# Patient Record
Sex: Female | Born: 1975
Health system: Southern US, Community
[De-identification: ages and names within clinical notes are randomized; demographics above are authoritative.]

## PROBLEM LIST (undated history)

## (undated) DIAGNOSIS — Z8619 Personal history of other infectious and parasitic diseases: Secondary | ICD-10-CM

## (undated) DIAGNOSIS — G43909 Migraine, unspecified, not intractable, without status migrainosus: Secondary | ICD-10-CM

## (undated) DIAGNOSIS — R350 Frequency of micturition: Secondary | ICD-10-CM

## (undated) DIAGNOSIS — R011 Cardiac murmur, unspecified: Secondary | ICD-10-CM

## (undated) DIAGNOSIS — N920 Excessive and frequent menstruation with regular cycle: Secondary | ICD-10-CM

## (undated) DIAGNOSIS — J309 Allergic rhinitis, unspecified: Secondary | ICD-10-CM

## (undated) HISTORY — PX: GASTRIC BYPASS: SHX52

## (undated) HISTORY — DX: Allergic rhinitis, unspecified: J30.9

## (undated) HISTORY — DX: Personal history of other infectious and parasitic diseases: Z86.19

## (undated) HISTORY — PX: ABLATION: SHX5711

## (undated) HISTORY — DX: Frequency of micturition: R35.0

## (undated) HISTORY — DX: Migraine, unspecified, not intractable, without status migrainosus: G43.909

## (undated) HISTORY — DX: Cardiac murmur, unspecified: R01.1

## (undated) HISTORY — PX: HERNIA REPAIR: SHX51

## (undated) HISTORY — DX: Excessive and frequent menstruation with regular cycle: N92.0

---

## 2004-08-29 ENCOUNTER — Observation Stay: Payer: Self-pay | Admitting: Obstetrics & Gynecology

## 2004-09-02 ENCOUNTER — Inpatient Hospital Stay: Payer: Self-pay

## 2005-06-04 HISTORY — PX: TUBAL LIGATION: SHX77

## 2006-03-10 ENCOUNTER — Inpatient Hospital Stay: Payer: Self-pay

## 2006-06-08 ENCOUNTER — Emergency Department: Payer: Self-pay | Admitting: Emergency Medicine

## 2006-12-30 ENCOUNTER — Emergency Department: Payer: Self-pay | Admitting: Emergency Medicine

## 2007-01-03 HISTORY — PX: UMBILICAL HERNIA REPAIR: SHX196

## 2007-01-14 ENCOUNTER — Ambulatory Visit: Payer: Self-pay | Admitting: Surgery

## 2007-01-16 ENCOUNTER — Ambulatory Visit: Payer: Self-pay | Admitting: Surgery

## 2007-05-13 ENCOUNTER — Ambulatory Visit: Payer: Self-pay | Admitting: Internal Medicine

## 2007-06-03 ENCOUNTER — Encounter: Payer: Self-pay | Admitting: Internal Medicine

## 2007-06-05 ENCOUNTER — Encounter: Payer: Self-pay | Admitting: Internal Medicine

## 2007-07-06 ENCOUNTER — Encounter: Payer: Self-pay | Admitting: Internal Medicine

## 2008-01-22 ENCOUNTER — Emergency Department: Payer: Self-pay | Admitting: Emergency Medicine

## 2008-01-23 ENCOUNTER — Ambulatory Visit: Payer: Self-pay | Admitting: Internal Medicine

## 2008-07-11 ENCOUNTER — Emergency Department: Payer: Self-pay | Admitting: Emergency Medicine

## 2008-07-16 ENCOUNTER — Ambulatory Visit: Payer: Self-pay | Admitting: Internal Medicine

## 2008-11-02 HISTORY — PX: LAMINECTOMY: SHX219

## 2009-02-01 ENCOUNTER — Ambulatory Visit: Payer: Self-pay | Admitting: Obstetrics & Gynecology

## 2009-02-02 ENCOUNTER — Ambulatory Visit: Payer: Self-pay | Admitting: Internal Medicine

## 2009-02-09 ENCOUNTER — Ambulatory Visit: Payer: Self-pay | Admitting: Obstetrics & Gynecology

## 2009-08-02 HISTORY — PX: OTHER SURGICAL HISTORY: SHX169

## 2009-08-19 IMAGING — CR DG LUMBAR SPINE AP/LAT/OBLIQUES W/ FLEX AND EXT
1 series · 5 of 5 positions shown · non-contrast
Comparison: none

REASON FOR EXAM: pain please send result back to dr
COMMENTS:

[Series 1: view not recorded · 0.17mm/px · 5 of 5 slices shown]
[im 1/5]
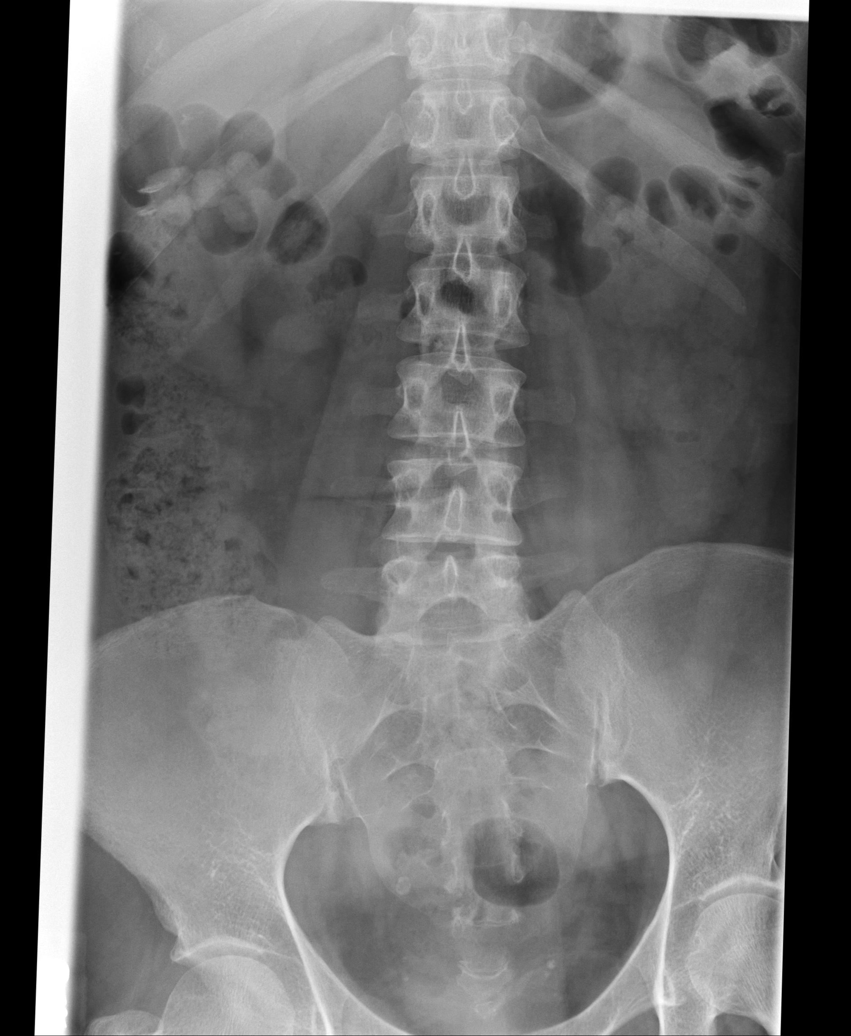
[im 2/5]
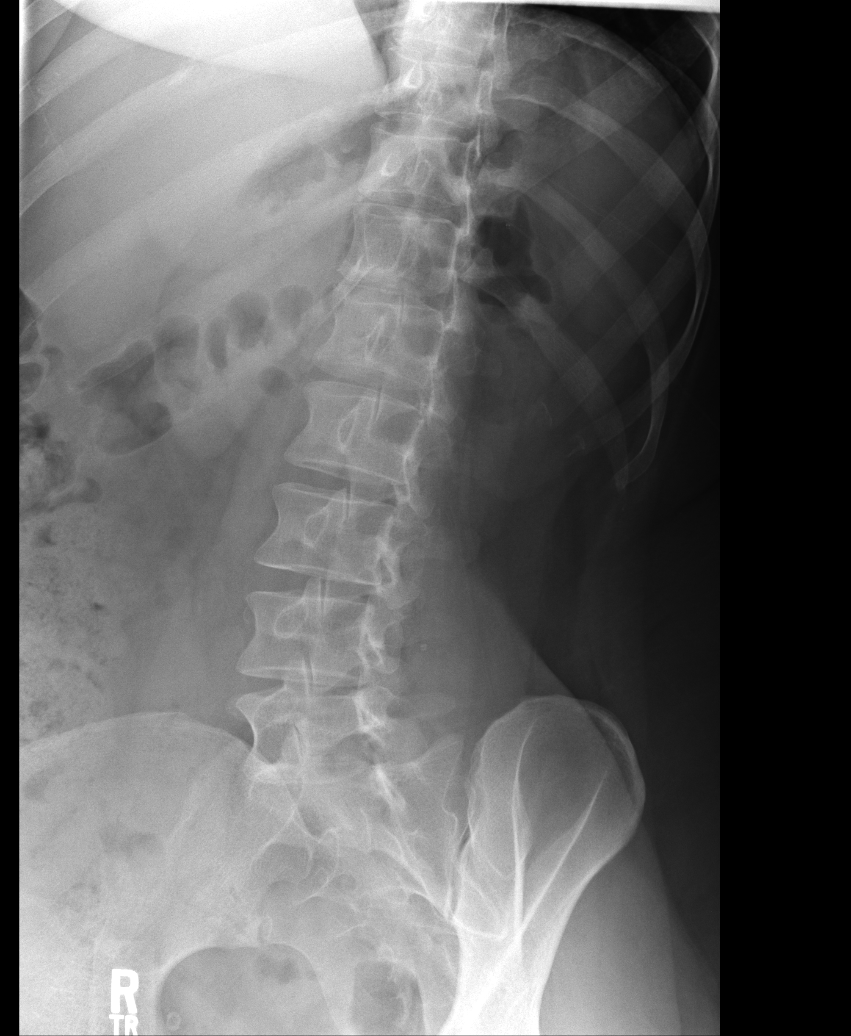
[im 3/5]
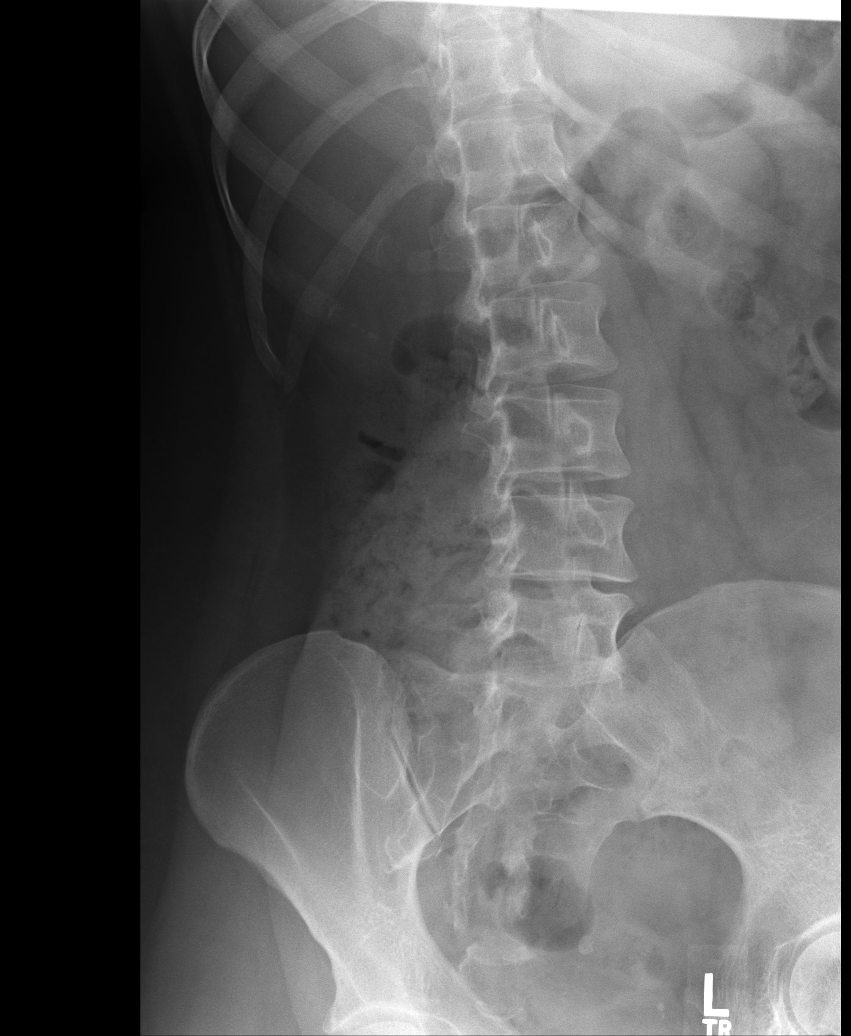
[im 4/5]
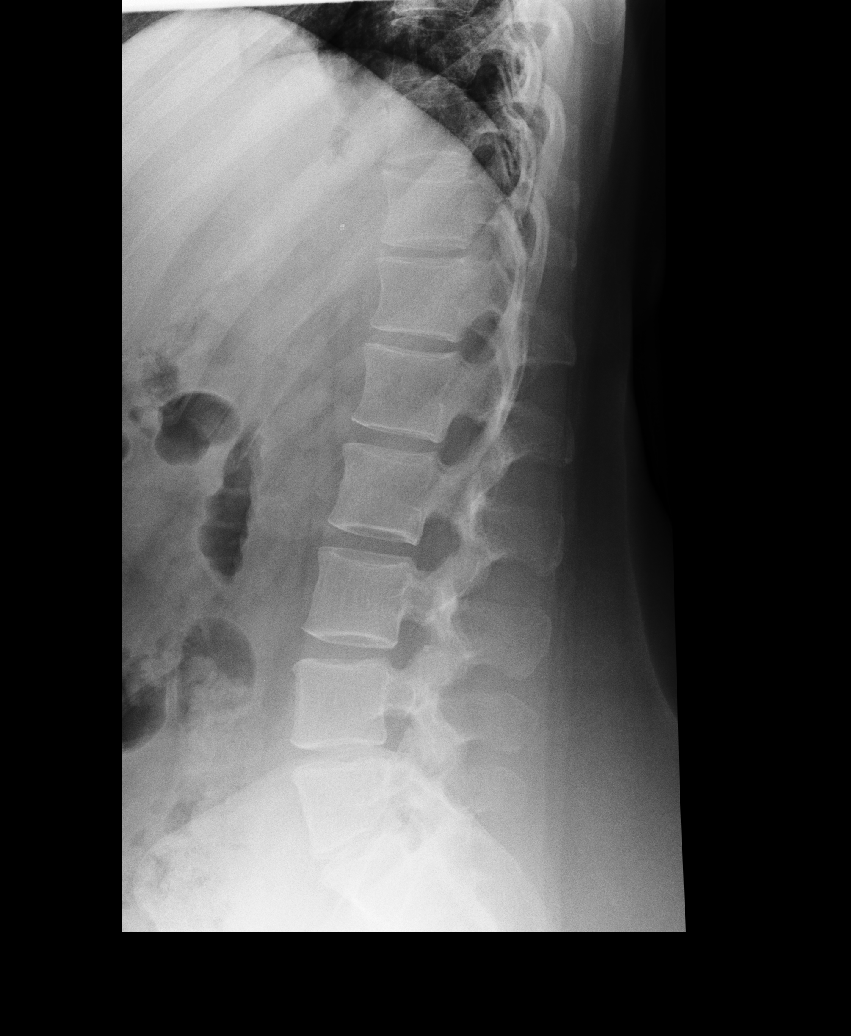
[im 5/5]
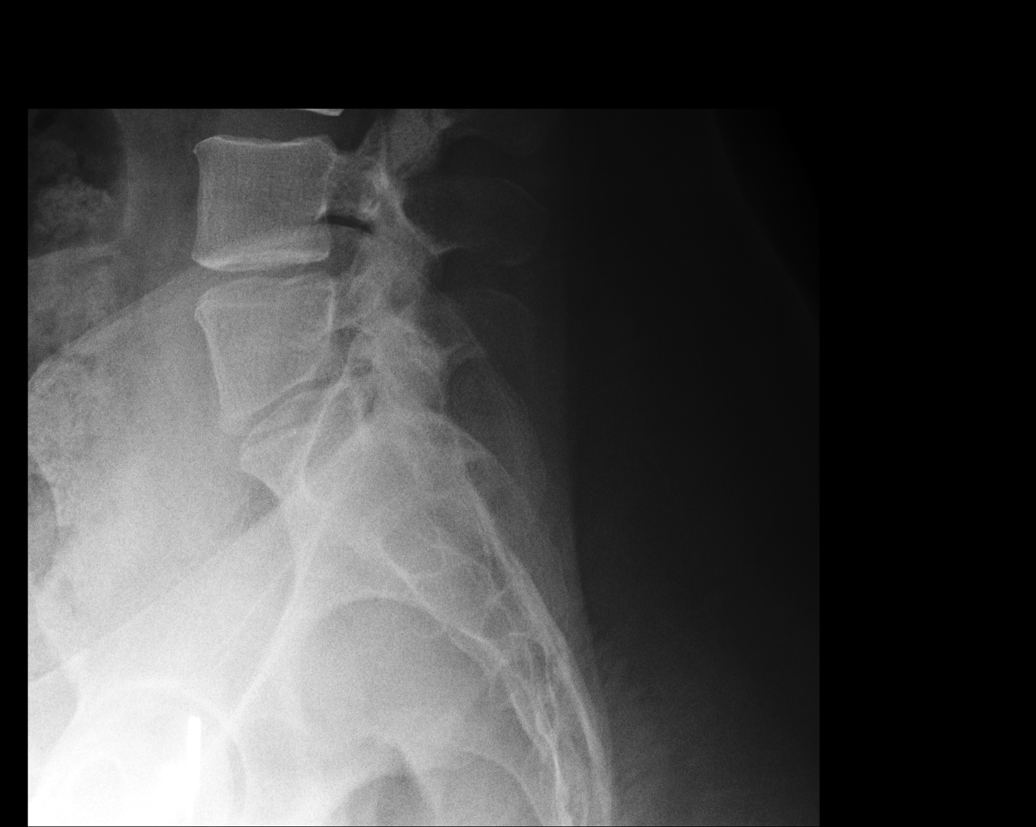

[5 of 5 positions shown; findings below may reference images not displayed]

PROCEDURE:     DXR - DXR LUMBAR SPINE WITH OBLIQUES  - May 13, 2007  [DATE]

RESULT:      The vertebral body heights are well maintained. The vertebral
body heights are well maintained. Vertebral body alignment is normal. There
is slight narrowing at the L4-L5 and L5-S1 intervertebral disc spaces. These
findings could be development but the possibility of early disc disease at
either site cannot be totally excluded. This could be further evaluated by
MR if such is clinically indicated. Oblique views show the articular facets
to be intact. The pedicles are normal appearance bilaterally.
IMPRESSION: 1. No fracture is seen.
2. There is slightly narrowed appearance to the L4-L5 and L5-S1
intervertebral disc spaces. The findings may be developmental but the
possibility of early manifestation of disc disease cannot be excluded. As
mentioned above, this could be further by MR if such is clinically indicated.

## 2009-10-30 ENCOUNTER — Ambulatory Visit: Payer: Self-pay | Admitting: Family Medicine

## 2009-12-24 ENCOUNTER — Ambulatory Visit: Payer: Self-pay | Admitting: Family Medicine

## 2010-07-04 NOTE — Assessment & Plan Note (Signed)
Summary: EAR INFECTION/JBB   Vital Signs:  Patient Profile:   35 Years Old Female CC:      left ear pain x 2 days// going to fly soon Height:     65.5 inches Weight:      207 pounds Temp:     98.5 degrees F oral Pulse rate:   80 / minute Pulse rhythm:   regular Resp:     18 per minute BP sitting:   106 / 70  (right arm) Cuff size:   large  Vitals Entered By: Providence Crosby LPN (Oct 30, 2009 11:52 AM)                  Current Allergies (reviewed today): ! PENICILLIN ! SULFA ! VICODIN ! VANCOMYCINHistory of Present Illness History from: patient Reason for visit: see chief complaint Chief Complaint: left ear pain x 2 days// going to fly soon History of Present Illness: left ear pain x 2 days// has a history of severe sinusitis.  Pt reports that she seen an ENT specialist at [facility] at least every 6 months.  Pt says she has gotten out of the habit of using her flonase regularly.  She had been told to use it daily.  She says that she has a long history of frequent ear infections.  She has a long history of having these infections so bad that she has had to use the [facility] to flush the sinuses.  Pt says she has normally taken cipro multiple times in the past as rxd by her ENT.  She is going out of town in a few days and wanted to have this cleared up if at all possible.   Current Meds TOPAMAX 100 MG TABS (TOPIRAMATE) 1 daily by mouth MULTIVITAMINS  TABS (MULTIPLE VITAMIN) 1 daily by mouth CALCIUM 600 IRON/D 600-18-125 MG-MG-UNIT TABS (CALCIUM-VITAMIN D-IRON) 1 tablet twice  a day DOXYCYCLINE HYCLATE 100 MG TABS (DOXYCYCLINE HYCLATE) take 1 by mouth two times a day as directed for treating infection. . . may cause some nausea FLONASE 50 MCG/ACT SUSP (FLUTICASONE PROPIONATE) 2 spray per nostril once daily  REVIEW OF SYSTEMS Constitutional Symptoms      Denies fever, chills, night sweats, weight loss, weight gain, and fatigue.  Eyes       Denies change in vision, eye pain, eye  discharge, glasses, contact lenses, and eye surgery. Ear/Nose/Throat/Mouth       Complains of ear pain, frequent runny nose, and sinus problems.      Denies hearing loss/aids, change in hearing, ear discharge, dizziness, frequent nose bleeds, sore throat, hoarseness, and tooth pain or bleeding.      Comments: also see HPI Respiratory       Denies dry cough, productive cough, wheezing, shortness of breath, asthma, bronchitis, and emphysema/COPD.  Cardiovascular       Denies murmurs, chest pain, and tires easily with exhertion.    Gastrointestinal       Denies stomach pain, nausea/vomiting, diarrhea, constipation, blood in bowel movements, and indigestion. Genitourniary       Denies painful urination, kidney stones, and loss of urinary control. Neurological       Complains of headaches.      Denies paralysis, seizures, and fainting/blackouts. Musculoskeletal       Denies muscle pain, joint pain, joint stiffness, decreased range of motion, redness, swelling, muscle weakness, and gout.  Skin       Denies bruising, unusual mles/lumps or sores, and hair/skin or nail changes.  Psych       Denies mood changes, temper/anger issues, anxiety/stress, speech problems, depression, and sleep problems.  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  Past Medical History: Chronic Sinusitis and Allergic Rhinitis Obesity Lap band surgery with gastric plication 08/25/2009 Laminectomy L4-L5 11/02/2008 umbiliical hernia 01-16-2007 bilateral tubal ligation 03/11/2006  Past Surgical History: Lap band surgery with gastric plication 08/25/2009 Laminectomy L4-L5 11/02/2008 umbilical hernia 01-16-2007 bilateral tubal ligation 10/08/200  Family  History: Father: alive at age 74 elevated bp Mother: alive with HTN and Hyperlipidemia Siblings: brother : alive and well Sister: 45 alive and well  Social History: Marital Status: Married Children: yes 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 Penicillin 2)  ! Sulfa 3)  ! Vicodin 4)  ! Vancomycin    Physical Exam General appearance: well developed, well nourished, no acute distress Head: normocephalic, atraumatic Eyes: conjunctivae and lids normal Pupils: equal, round, reactive to light Ears: fluid noted without inflammation left TM, appears yellow on the bottom layers Nasal: swollen red turbinates with congestion Oral/Pharynx: tongue normal, posterior pharynx without erythema or exudate Neck: neck supple,  trachea midline, no masses Chest/Lungs: no rales, wheezes, or rhonchi bilateral, breath sounds equal without effort Heart: regular rate and  rhythm, no murmur Abdomen: soft, non-tender without obvious organomegaly Extremities: normal extremities Neurological: grossly intact and non-focal Skin: no obvious rashes or lesions MSE: oriented to time, place, and person Assessment Problems:   New Problems: ALLERGIC RHINITIS (ICD-477.9) OTITIS MEDIA, SEROUS (ICD-381.4) SINUSITIS, CHRONIC (ICD-473.9)   Patient Education: Patient and/or caregiver instructed in the following: rest, fluids. Demonstrates willingness to comply.  Plan New Medications/Changes: DOXYCYCLINE HYCLATE 100 MG TABS (DOXYCYCLINE HYCLATE) take 1 by mouth two times a day as directed for treating infection. . . may cause some nausea  #28 x 0, 10/30/2009, Standley Dakins MD  New Orders: New Patient Level II [84132] Follow Up: Follow up in 1-2 days if no improvement, Follow up with Primary Physician Follow Up: with ENT physician  The patient and/or  caregiver has been counseled thoroughly with regard to medications prescribed including dosage, schedule, interactions, rationale for use, and possible side effects and they verbalize understanding.  Diagnoses and expected course of recovery discussed and will return if not improved as expected or if the condition worsens. Patient and/or caregiver verbalized understanding.  Prescriptions: DOXYCYCLINE HYCLATE 100 MG TABS (DOXYCYCLINE HYCLATE) take 1 by mouth two times a day as directed for treating infection. . . may cause some nausea  #28 x 0   Entered and Authorized by:   Standley Dakins MD   Signed by:   Standley Dakins MD on 10/30/2009   Method used:   Electronically to        [facility]. # [phone]* (retail)       [address]       [place], [place]  [address]       Ph: [phone]       Fax: [phone]   RxID:   [id number]   Patient Instructions: 1)  Please hold off taking the calcium/iron supplements until you  complete your antibiotic treatment (doxycycline). 2)  Please remember to start using your flonase nasal spray on a daily basis (2 sprays/nostril). 3)  Please follow up with your ENT specialist ASAP.  4)  Please follow up with your Primary Medical Care Provider. 5)  Please remember that the doxycycline can make you feel a little nauseated after you take it but that sensation should not last long and remember to avoid heavy sun exposure while taking doxycycline.  This medicine makes you more photosensitive.   The risks, benefits and possible side effects were clearly explained and discussed with the patient.  The patient verbalized clear understanding.  The patient was given instructions to return if symptoms don't improve, worsen or new changes develop.  If it is not during clinic hours and the patient cannot get back to this clinic then the patient was told to seek medical care at an available urgent care or emergency department.  The patient verbalized  understanding.    The patient was informed that there is no on-call provider or services available at this clinic during off-hours (when the clinic is closed).  If the patient developed a problem or concern that required immediate attention, the patient was advised to go the the nearest available urgent care or emergency department for medical care.  The patient verbalized understanding.     Rodney Langton, M.D., F.A.A.F.P.  Oct 30, 2009

## 2010-09-25 ENCOUNTER — Ambulatory Visit: Payer: Self-pay | Admitting: Internal Medicine

## 2011-04-22 ENCOUNTER — Other Ambulatory Visit: Payer: Self-pay | Admitting: Internal Medicine

## 2011-08-27 ENCOUNTER — Other Ambulatory Visit: Payer: Self-pay | Admitting: Internal Medicine

## 2011-08-27 MED ORDER — FLUTICASONE PROPIONATE 50 MCG/ACT NA SUSP: 2.0000 | Freq: Every day | NASAL | Status: DC

## 2011-08-27 NOTE — Telephone Encounter (Signed)
[facility] . flonase 50 mcg. Needing a refill prior to appt.

## 2011-10-26 ENCOUNTER — Ambulatory Visit: Payer: Self-pay | Admitting: Internal Medicine

## 2012-03-27 ENCOUNTER — Telehealth: Payer: Self-pay | Admitting: Internal Medicine

## 2012-03-27 NOTE — Telephone Encounter (Signed)
Pt came in today wanting to get flu shot and to make an appointment for sinus infection. Pt has never been seen at this practice made new patient appointment for 3/4 pt wanted to know if she could be seen sooner Pt had new  Patient appointment 10/26/11 and cancelled pt stated she wasn't sick and didn't need to see dr Darrick Huntsman at that time

## 2012-03-31 NOTE — Telephone Encounter (Signed)
i have not changed my previous response to this type of request.  i will not take up 2 urgent slots for a new patient .

## 2012-04-01 ENCOUNTER — Telehealth: Payer: Self-pay | Admitting: Internal Medicine

## 2012-04-01 NOTE — Telephone Encounter (Signed)
Left message about keeping her appt in March on pt's voicemail.

## 2012-04-01 NOTE — Telephone Encounter (Signed)
Pt called back she was fine with waiting until March for her appointment but she went to the Pharmacy to get the flu shot but her insurance does not cover any pharmacy visit. She is extremely worried and not sure what to do because she says her husband is a paraplegic and her kids are small. She can't be sick around her husband because of his health issues and is needing the flu shot. She wanted to me ask the office manager if she could come in just to have the flu shot ???

## 2012-04-01 NOTE — Telephone Encounter (Signed)
error 

## 2012-05-31 ENCOUNTER — Other Ambulatory Visit: Payer: Self-pay | Admitting: Internal Medicine

## 2012-08-04 ENCOUNTER — Ambulatory Visit (INDEPENDENT_AMBULATORY_CARE_PROVIDER_SITE_OTHER): Payer: BC Managed Care – PPO | Admitting: Internal Medicine

## 2012-08-04 ENCOUNTER — Encounter: Payer: Self-pay | Admitting: Internal Medicine

## 2012-08-04 VITALS — BP 100/70 | HR 78 | Temp 98.1°F | Ht 65.0 in | Wt 168.5 lb

## 2012-08-04 DIAGNOSIS — J309 Allergic rhinitis, unspecified: Secondary | ICD-10-CM

## 2012-08-04 DIAGNOSIS — R5383 Other fatigue: Secondary | ICD-10-CM

## 2012-08-04 DIAGNOSIS — F5105 Insomnia due to other mental disorder: Secondary | ICD-10-CM

## 2012-08-04 DIAGNOSIS — R5381 Other malaise: Secondary | ICD-10-CM

## 2012-08-04 DIAGNOSIS — R413 Other amnesia: Secondary | ICD-10-CM

## 2012-08-04 DIAGNOSIS — Z6825 Body mass index (BMI) 25.0-25.9, adult: Secondary | ICD-10-CM

## 2012-08-04 DIAGNOSIS — K5900 Constipation, unspecified: Secondary | ICD-10-CM

## 2012-08-04 DIAGNOSIS — F419 Anxiety disorder, unspecified: Secondary | ICD-10-CM | POA: Insufficient documentation

## 2012-08-04 DIAGNOSIS — Z9884 Bariatric surgery status: Secondary | ICD-10-CM | POA: Insufficient documentation

## 2012-08-04 DIAGNOSIS — J329 Chronic sinusitis, unspecified: Secondary | ICD-10-CM

## 2012-08-04 DIAGNOSIS — F411 Generalized anxiety disorder: Secondary | ICD-10-CM

## 2012-08-04 DIAGNOSIS — E663 Overweight: Secondary | ICD-10-CM

## 2012-08-04 DIAGNOSIS — K5909 Other constipation: Secondary | ICD-10-CM | POA: Insufficient documentation

## 2012-08-04 LAB — COMPREHENSIVE METABOLIC PANEL
Albumin: 4 g/dL (ref 3.5–5.2)
Alkaline Phosphatase: 36 U/L — ABNORMAL LOW (ref 39–117)
BUN: 14 mg/dL (ref 6–23)
CO2: 25 mEq/L (ref 19–32)
Calcium: 8.9 mg/dL (ref 8.4–10.5)
GFR: 93.08 mL/min (ref >60.00)
Glucose, Bld: 96 mg/dL (ref 70–99)
Potassium: 3.7 mEq/L (ref 3.5–5.1)

## 2012-08-04 LAB — TSH: TSH: 1.05 u[IU]/mL (ref 0.35–5.50)

## 2012-08-04 MED ORDER — ALPRAZOLAM 0.5 MG PO TABS: 0.5000 mg | ORAL_TABLET | Freq: Every evening | ORAL | Status: DC | PRN

## 2012-08-04 MED ORDER — FLUTICASONE PROPIONATE 50 MCG/ACT NA SUSP: NASAL | Status: DC

## 2012-08-04 NOTE — Assessment & Plan Note (Signed)
Advised to increase fiber in diet. Checking lytes, thyroid function.

## 2012-08-04 NOTE — Assessment & Plan Note (Signed)
secodnary to anxiety vs vitamin deficiencies,  Checking b12 and thyroid levels today

## 2012-08-04 NOTE — Assessment & Plan Note (Signed)
Prn alprazolam, used sparingly.  Life and financial stressors cited as cause.

## 2012-08-04 NOTE — Assessment & Plan Note (Signed)
Not on vitamin supplementation consistently. Checking vit d . B12, mg etc today.

## 2012-08-04 NOTE — Progress Notes (Signed)
Patient ID: Stephanie Boyd, female   DOB: January 03, 1976, 37 y.o.   MRN: [id number] Patient Active Problem List  Diagnosis  . Insomnia secondary to anxiety  . Overweight (BMI 25.0-29.9)  . Short-term memory loss  . S/P bariatric surgery  . Recurrent sinusitis  . Allergic rhinitis    Subjective:  CC:   Chief Complaint  Patient presents with  . Establish Care    HPI:   Stephanie Boyd is a 37 y.o. female who presents as a new patient to establish primary care with the chief complaint of Throat tickle, accompanied by nonproductive ough and headache,  Aggravated by supine postion..  Nasal drainage is clear.  Positive sick contacts, All the men in family had it last week.  Had the flu shot n October, the live virus nasal spray .   Overweight.  She has a history of morbid obesity,  S/p lap band with gastric plication  March 2010.  Has lost 75 lbs so far,  Has not plateaued yet . Has been taking bariatric vitamins inconsistently for the last year, nonce since December and has had some short term memory loss. Diet is low carb, lots of salad and meat, Averages only 2 meals per day bc of early morning intolerance to solid food.   Goal is 150.   Occasional rare insomnia lasting about a week .  Uses 0.25 mg alprazolam prn . Her husband Christiane Ha  is paraplegic and she has gone back to work due to financial pressures.  Her sons are aged 69 and 84  In 57 and 2nd grade .  Both have been diagnosed with fructose malabsorption which causes rash, severe constipation and encopresis in once son and severe diarrhea in the other with overindulgence in fructose. Thinks she may have had it herself, bc she has had the symptoms with migraines,  All of which have resolved with low carb diet.   Constipation,  3 to 4 hard stools per week.  Gets a lot of gas with increased fiber.  Treated with levaquin for sinusitis/otitis one month ago .  Has been having 4 or 5 sinus and ear infections per year .  Needs Ig levels  checked.    History of both hips having going out of joint during intercourse secondary to lax ligaments..    Health Maintenance: Dr. Tiburcio Pea did annual PAP last fall and mammogram (2nd) due this fall to be ordered by him   Past Medical History  Diagnosis Date  . History of chicken pox   . Migraines   . Allergic rhinitis   . Heart murmur     Past Surgical History  Procedure Laterality Date  . Tubal ligation  2007  . Umbilical hernia repair  01/2007  . Laminectomy  11/2008    L4-5  . Lapband  08/2009    and gastric plication    Family History  Problem Relation Age of Onset  . Hypertension Mother   . Diabetes Mother   . Hypertension Father   . Cancer Father     melonoma  . Heart disease Father     A-fib    History   Social History  . Marital Status: Married    Spouse Name: N/A    Number of Children: N/A  . Years of Education: N/A   Occupational History  . Not on file.   Social History Main Topics  . Smoking status: Never Smoker   . Smokeless tobacco: Never Used  . Alcohol Use: Yes  Comment: rarely  . Drug Use: No  . Sexually Active: Not on file   Other Topics Concern  . Not on file   Social History Narrative  . No narrative on file    Allergies  Allergen Reactions  . Hydrocodone-Acetaminophen     REACTION: rash  . Penicillins     REACTION: rash  . Sulfonamide Derivatives     REACTION: rash  . Vancomycin     REACTION: reddness   Review of Systems:   The remainder of the review of systems was negative except those addressed in the HPI.   Objective:  BP 100/70  Pulse 78  Temp(Src) 98.1 F (36.7 C) (Oral)  Ht 5\' 5"  (1.651 m)  Wt 168 lb 8 oz (76.431 kg)  BMI 28.04 kg/m2  SpO2 98%  LMP 08/04/2012  General appearance: alert, cooperative and appears stated age Ears: normal TM's and external ear canals both ears Throat: lips, mucosa, and tongue normal; teeth and gums normal Neck: no adenopathy, no carotid bruit, supple, symmetrical,  trachea midline and thyroid not enlarged, symmetric, no tenderness/mass/nodules Back: symmetric, no curvature. ROM normal. No CVA tenderness. Lungs: clear to auscultation bilaterally Heart: regular rate and rhythm, S1, S2 normal, no murmur, click, rub or gallop Abdomen: soft, non-tender; bowel sounds normal; no masses,  no organomegaly Pulses: 2+ and symmetric Skin: Skin color, texture, turgor normal. No rashes or lesions Lymph nodes: Cervical, supraclavicular, and axillary nodes normal.  Assessment and Plan:  Insomnia secondary to anxiety Prn alprazolam, used sparingly.  Life and financial stressors cited as cause.   Short-term memory loss secodnary to anxiety vs vitamin deficiencies,  Checking b12 and thyroid levels today  S/P bariatric surgery Not on vitamin supplementation consistently. Checking vit d . B12, mg etc today.  Recurrent sinusitis Checking Ig levels to rule out CVID/  Constipation, chronic Advised to increase fiber in diet. Checking lytes, thyroid function.    Updated Medication List Outpatient Encounter Prescriptions as of 08/04/2012  Medication Sig Dispense Refill  . fluticasone (FLONASE) 50 MCG/ACT nasal spray INSTILL 2 SPRAYS IN EACH NOSTRIL EVERY DAY  16 g  5  . [DISCONTINUED] fluticasone (FLONASE) 50 MCG/ACT nasal spray INSTILL 2 SPRAYS IN EACH NOSTRIL EVERY DAY  16 g  5  . ALPRAZolam (XANAX) 0.5 MG tablet Take 1 tablet (0.5 mg total) by mouth at bedtime as needed for sleep.  30 tablet  3   No facility-administered encounter medications on file as of 08/04/2012.

## 2012-08-04 NOTE — Patient Instructions (Addendum)
Try increasing the fiber in your diet to help your constipation   Dreamfield's   Low carb pasta  Mission: "Carb balance" whole wheat tortilla,  26 g fiber 6 net carbs  This is  my version of a  "Low GI"  Diet:  It is not ultra low carb, but will still lower your blood sugars and allow you to lose 5 to 10 lbs per month if you follow it carefully. All of the foods can be found at grocery stores and in bulk at [employer].  The Atkins protein bars and shakes are available in more varieties at [employer], [employer] and [employer].     7 AM Breakfast:  Low carbohydrate Protein  Shakes (I recommend the EAS AdvantEdge "Carb Control" shakes  Or the low carb shakes by Atkins.   Both are available everywhere:  In  cases at [employer]Js  Or in 4 packs at grocery stores and pharmacies  2.5 carbs  (Alternative is  a toasted Arnold's Sandwhich Thin w/ peanut butter, a "Bagel Thin" with cream cheese and salmon) or  a scrambled egg burrito made with a low carb tortilla .  Avoid cereal and bananas, oatmeal too unless you are cooking the old fashioned kind that takes 30-40 minutes to prepare.  the rest is overly processed, has minimal fiber, and is loaded with carbohydrates!   10 AM: Protein bar by Atkins (the snack size, under 200 cal).  There are many varieties , available widely again or in bulk in limited varieties at BJs)  Other so called "protein bars" tend to be loaded with carbohydrates.  Remember, in food advertising, the word "energy" is synonymous for " carbohydrate."  Lunch: sandwich of [place], (or any lunchmeat, grilled meat or canned tuna), fresh avocado, mayonnaise  and cheese on a lower carbohydrate pita bread, flatbread, or tortilla . Ok to use regular mayonnaise. The bread is the only source or carbohydrate that can be decreased (Joseph's makes a pita bread and a flat bread that are 50 cal and 4 net carbs ; Toufayan makes a low carb flatbread that's 100 cal and 9 net carbs  and  Mission makes a low carb whole wheat  tortilla  That is 210 cal and 6 net carbs)  3 PM:  Mid day :  Another protein bar,  Or a  cheese stick (100 cal, 0 carbs),  Or 1 ounce of  almonds, walnuts, pistachios, pecans, peanuts,  Macadamia nuts. Or a Dannon light n Fit greek yogurt, 80 cal 8 net carbs . Avoid "granola"; the dried cranberries and raisins are loaded with carbohydrates. Mixed nuts ok if no raisins or cranberries or dried fruit.      6 PM  Dinner:  "mean and green:"  Meat/chicken/fish or a high protein legume; , with a green salad, and a low GI  Veggie (broccoli, cauliflower, green beans, spinach, brussel sprouts. Lima beans) : Avoid "Low fat dressings, as well as Reyne Dumas and [address]! They are loaded with sugar! Instead use ranch, vinagrette,  Blue cheese, etc.  There is a low carb pasta by Dreamfield's available at [facility] that is acceptable and tastes great. Try Michel Angel's chicken piccata over low carb pasta. The chicken dish is 0 carbs, and can be found in frozen section at BJs and [employer]. Also try [employer] "Carnitas" (pulled pork, no sauce,  0 carbs) and his pot roast.   both are in the refrigerated section at BJs   Dreamfield's makes a low  carb pasta only 5 g/serving.  Available at all grocery stores,  And tastes like normal pasta  9 PM snack : Breyer's "low carb" fudgsicle or  ice cream bar (Carb Smart line), or  Weight Watcher's ice cream bar , or another "no sugar added" ice cream;a serving of fresh berries/cherries with whipped cream (Avoid bananas, pineapple, grapes  and watermelon on a regular basis because they are high in sugar)   Remember that snack Substitutions should be less than 10 carbs per serving and meals < 20 carbs. Remember to subtract fiber grams and sugar alcohols to get the "net carbs."

## 2012-08-04 NOTE — Assessment & Plan Note (Signed)
Checking Ig levels to rule out CVID/

## 2012-08-05 ENCOUNTER — Encounter: Payer: Self-pay | Admitting: Internal Medicine

## 2012-08-05 LAB — IGG, IGA, IGM: IgM, Serum: 112 mg/dL (ref 52–322)

## 2012-08-05 LAB — VITAMIN D 25 HYDROXY (VIT D DEFICIENCY, FRACTURES): Vit D, 25-Hydroxy: 26 ng/mL — ABNORMAL LOW (ref 30–89)

## 2012-08-29 ENCOUNTER — Encounter: Payer: Self-pay | Admitting: Internal Medicine

## 2012-11-27 ENCOUNTER — Encounter: Payer: Self-pay | Admitting: Adult Health

## 2012-11-27 ENCOUNTER — Ambulatory Visit (INDEPENDENT_AMBULATORY_CARE_PROVIDER_SITE_OTHER): Payer: BC Managed Care – PPO | Admitting: Adult Health

## 2012-11-27 VITALS — BP 100/58 | HR 68 | Temp 98.6°F | Resp 12 | Wt 167.5 lb

## 2012-11-27 DIAGNOSIS — H9203 Otalgia, bilateral: Secondary | ICD-10-CM

## 2012-11-27 DIAGNOSIS — H9209 Otalgia, unspecified ear: Secondary | ICD-10-CM

## 2012-11-27 MED ORDER — AZITHROMYCIN 250 MG PO TABS: ORAL_TABLET | ORAL | Status: DC

## 2012-11-27 MED ORDER — ANTIPYRINE-BENZOCAINE 5.4-1.4 % OT SOLN: 3.0000 [drp] | OTIC | Status: DC | PRN

## 2012-11-27 NOTE — Assessment & Plan Note (Signed)
Her pain is only occurring when she lies down at bedtime. This may be secondary to increased congestion within her sinuses. Her tympanic membranes bilaterally appear normal. I have provided patient with a prescription for azithromycin to take with her on vacation in the event that she develops fever, greenish drainage. I have advised her not to take this prescription unless those symptoms develop. Prescription for Auralgan ear drops for pain.

## 2012-11-27 NOTE — Progress Notes (Signed)
  Subjective:    Patient ID: Stephanie Boyd, female    DOB: 18-Jul-1975, 36 y.o.   MRN: [id number]  HPI  Patient is a 37 year old female who presents to clinic with complaints of bilateral ear pain mainly when she lies down at night. Patient has history of significant sinus problems with recurrent sinusitis. She is leaving for vacation with her family tomorrow for the [employer] and wants to make sure that she is not developing an infection. She denies fever or chills, rhinorrhea. Patient is beginning to feel slightly congested with in her sinuses. She uses a nettie pot to irrigate sinuses regularly. She is also using the Dymista nasal spray. Patient is not currently on an oral antihistamine.  Current Outpatient Prescriptions on File Prior to Visit  Medication Sig Dispense Refill  . ALPRAZolam (XANAX) 0.5 MG tablet Take 1 tablet (0.5 mg total) by mouth at bedtime as needed for sleep.  30 tablet  3  . fluticasone (FLONASE) 50 MCG/ACT nasal spray INSTILL 2 SPRAYS IN EACH NOSTRIL EVERY DAY  16 g  5   No current facility-administered medications on file prior to visit.    Review of Systems  Constitutional: Negative for fever and chills.  HENT: Positive for ear pain and congestion. Negative for sore throat, rhinorrhea and postnasal drip.   Respiratory: Negative for cough.   Cardiovascular: Negative.   Allergic/Immunologic:       Seasonal allergies       Objective:   Physical Exam  Constitutional: She is oriented to person, place, and time. She appears well-developed and well-nourished. No distress.  HENT:  Head: Normocephalic and atraumatic.  Right Ear: External ear normal.  Left Ear: External ear normal.  Nose: Nose normal.  Mouth/Throat: Oropharynx is clear and moist.  Eyes: Conjunctivae are normal. Pupils are equal, round, and reactive to light.  Neck: Normal range of motion. Neck supple.  Cardiovascular: Normal rate and regular rhythm.   Pulmonary/Chest: Effort normal. No  respiratory distress.  Lymphadenopathy:    She has no cervical adenopathy.  Neurological: She is alert and oriented to person, place, and time.  Skin: Skin is warm and dry.  Psychiatric: She has a normal mood and affect. Her behavior is normal. Judgment and thought content normal.          Assessment & Plan:

## 2013-04-05 ENCOUNTER — Other Ambulatory Visit: Payer: Self-pay | Admitting: Internal Medicine

## 2013-04-09 ENCOUNTER — Other Ambulatory Visit: Payer: Self-pay

## 2013-08-22 ENCOUNTER — Other Ambulatory Visit: Payer: Self-pay | Admitting: Internal Medicine

## 2013-11-06 ENCOUNTER — Other Ambulatory Visit: Payer: Self-pay | Admitting: Internal Medicine

## 2013-12-02 HISTORY — PX: ABDOMINOPLASTY: SUR9

## 2014-08-16 ENCOUNTER — Encounter
Admit: 2014-08-16 | Disposition: A | Discharge status: Home / Self Care | Payer: Self-pay | Attending: Rehabilitation | Admitting: Rehabilitation

## 2014-09-03 ENCOUNTER — Encounter
Admit: 2014-09-03 | Disposition: A | Discharge status: Home / Self Care | Payer: Self-pay | Attending: Rehabilitation | Admitting: Rehabilitation

## 2014-09-24 ENCOUNTER — Encounter: Payer: Self-pay | Admitting: Internal Medicine

## 2014-09-24 ENCOUNTER — Ambulatory Visit (INDEPENDENT_AMBULATORY_CARE_PROVIDER_SITE_OTHER): Payer: Managed Care, Other (non HMO) | Admitting: Internal Medicine

## 2014-09-24 VITALS — BP 108/66 | HR 80 | Temp 97.9°F | Resp 14 | Ht 65.25 in | Wt 162.2 lb

## 2014-09-24 DIAGNOSIS — Z1159 Encounter for screening for other viral diseases: Secondary | ICD-10-CM

## 2014-09-24 DIAGNOSIS — K5909 Other constipation: Secondary | ICD-10-CM

## 2014-09-24 DIAGNOSIS — Z23 Encounter for immunization: Secondary | ICD-10-CM | POA: Diagnosis not present

## 2014-09-24 DIAGNOSIS — E785 Hyperlipidemia, unspecified: Secondary | ICD-10-CM | POA: Diagnosis not present

## 2014-09-24 DIAGNOSIS — Z9884 Bariatric surgery status: Secondary | ICD-10-CM

## 2014-09-24 DIAGNOSIS — E538 Deficiency of other specified B group vitamins: Secondary | ICD-10-CM | POA: Diagnosis not present

## 2014-09-24 DIAGNOSIS — E559 Vitamin D deficiency, unspecified: Secondary | ICD-10-CM

## 2014-09-24 DIAGNOSIS — D509 Iron deficiency anemia, unspecified: Secondary | ICD-10-CM

## 2014-09-24 DIAGNOSIS — E663 Overweight: Secondary | ICD-10-CM

## 2014-09-24 DIAGNOSIS — Z0000 Encounter for general adult medical examination without abnormal findings: Secondary | ICD-10-CM

## 2014-09-24 DIAGNOSIS — R5383 Other fatigue: Secondary | ICD-10-CM

## 2014-09-24 DIAGNOSIS — K5900 Constipation, unspecified: Secondary | ICD-10-CM

## 2014-09-24 DIAGNOSIS — K5901 Slow transit constipation: Secondary | ICD-10-CM

## 2014-09-24 DIAGNOSIS — Z0189 Encounter for other specified special examinations: Secondary | ICD-10-CM

## 2014-09-24 MED ORDER — AZELASTINE-FLUTICASONE 137-50 MCG/ACT NA SUSP: NASAL | Status: DC

## 2014-09-24 NOTE — Progress Notes (Signed)
Patient ID: Stephanie Boyd, female   DOB: 22-Nov-1975, 39 y.o.   MRN: [id number]     Subjective:   4  Stephanie Boyd is a 39 y.o. female and is here for a comprehensive physical exam. The patient reports recent problems with weight gain due to problems with her lap band shifting . She had a net 45 LB WT LOSS SINCE GASTRIC SURGERY which was done March  2010.  5 LBS SINCE LAST VISIT 2014.  Had an abdominoplasty last summer.  Pelvic and breast exams are done by GYN due to abnormal PAP smears.  Colposcopy dec 3rd normal,  6 month follow up planned     History   Social History  . Marital Status: Married    Spouse Name: N/A  . Number of Children: N/A  . Years of Education: N/A   Occupational History  . Not on file.   Social History Main Topics  . Smoking status: Never Smoker   . Smokeless tobacco: Never Used  . Alcohol Use: Yes     Comment: rarely  . Drug Use: No  . Sexual Activity: Not on file   Other Topics Concern  . Not on file   Social History Narrative   Health Maintenance  Topic Date Due  . HIV Screening  01/11/1989  . PAP SMEAR  01/12/1992  . INFLUENZA VACCINE  01/03/2015  . TETANUS/TDAP  09/23/2024    The following portions of the patient's history were reviewed and updated as appropriate: allergies, current medications, past family history, past medical history, past social history, past surgical history and problem list.  Review of Systems  Patient denies headache, fevers, malaise, unintentional weight loss, skin rash, eye pain, sinus congestion and sinus pain, sore throat, dysphagia,  hemoptysis , cough, dyspnea, wheezing, chest pain, palpitations, orthopnea, edema, abdominal pain, nausea, melena, diarrhea, constipation, flank pain, dysuria, hematuria, urinary  Frequency, nocturia, numbness, tingling, seizures,  Focal weakness, Loss of consciousness,  Tremor, insomnia, depression, anxiety, and suicidal ideation.      Objective:   BP 108/66 mmHg  Pulse 80   Temp(Src) 97.9 F (36.6 C) (Oral)  Resp 14  Ht 5' 5.25" (1.657 m)  Wt 162 lb 4 oz (73.596 kg)  BMI 26.80 kg/m2  SpO2 98%  LMP 09/16/2014 (Exact Date)  General appearance: alert, cooperative and appears stated age Ears: normal TM's and external ear canals both ears Throat: lips, mucosa, and tongue normal; teeth and gums normal Neck: no adenopathy, no carotid bruit, supple, symmetrical, trachea midline and thyroid not enlarged, symmetric, no tenderness/mass/nodules Back: symmetric, no curvature. ROM normal. No CVA tenderness. Lungs: clear to auscultation bilaterally Heart: regular rate and rhythm, S1, S2 normal, no murmur, click, rub or gallop Abdomen: soft, non-tender; bowel sounds normal; no masses,  no organomegaly Pulses: 2+ and symmetric Skin: Skin color, texture, turgor normal. No rashes or lesions Lymph nodes: Cervical, supraclavicular, and axillary nodes normal.  .    Assessment and Plan:   Problem List Items Addressed This Visit    Overweight (BMI 25.0-29.9)    I have addressed  BMI and recommended wt loss of 10% of body weigh over the next 6 months using a low glycemic index diet and regular exercise a minimum of 5 days per week.        S/P bariatric surgery    With recent malfunction now resolved.  Weight gain occurred, which she has addressed.       Constipation, chronic    Recommended an increase  in fiber intake to 25 to 35 grams daily.  Made several dietary suggestions of high fiber content including Mission low carb whole wheat tortillas which have 26 g fiber/serving, Toufayan flatbread which has around 10 g fiber,   Daily serving of any type of nuts,  and Atkins protein bars which have 8 to 10 g fiber.   Dispelled the popularly held notion that [employer] oatmeal, whole grain bread and most commericially made cereals have adequate fiber.  Finally I recommended daily use of Miralax., metamucil, citrucel, benefiber  or fibercon.       Wellness examination     Annual  wellness  exam was done as well as a comprehensive physical exam and management of acute and chronic conditions .  During the course of the visit the patient was educated and counseled about appropriate screening and preventive services including : fall prevention , diabetes screening, nutrition counseling, colorectal cancer screening, and recommended immunizations.  Printed recommendations for health maintenance screenings was given.       RESOLVED: Constipation    Other Visit Diagnoses    B12 deficiency    -  Primary    Relevant Orders    Vitamin B12    Vitamin D deficiency        Relevant Orders    Vit D  25 hydroxy (rtn osteoporosis monitoring)    Other fatigue        Relevant Orders    Comprehensive metabolic panel    TSH    Hyperlipidemia        Relevant Orders    Lipid panel    Need for hepatitis C screening test        Relevant Orders    Hepatitis C antibody    Anemia, iron deficiency        Relevant Orders    CBC with Differential/Platelet    IBC panel    Need for prophylactic vaccination with combined diphtheria-tetanus-pertussis (DTP) vaccine        Relevant Orders    Tdap vaccine greater than or equal to 7yo IM (Completed)

## 2014-09-24 NOTE — Patient Instructions (Addendum)
You look fantastic!!    Here are a couple of low carb snacks to help you stay on track  Premier  Protein chocolate shake  WAS[employer] Crackers ([employer],  [employer])     Mini sweet peppers  Try artichoke tapenade , sundried tomato tapenade,   Joseph's Lavash bread  Remember to subtract fiber grams!  Your constipation is from a lack of fiber in your diet I  Recommend  daily use of Miralax., metamucil, citrucel, benefiber  or fibercon for your constipation.      Health Maintenance Adopting a healthy lifestyle and getting preventive care can go a long way to promote health and wellness. Talk with your health care provider about what schedule of regular examinations is right for you. This is a good chance for you to check in with your provider about disease prevention and staying healthy. In between checkups, there are plenty of things you can do on your own. Experts have done a lot of research about which lifestyle changes and preventive measures are most likely to keep you healthy. Ask your health care provider for more information. WEIGHT AND DIET  Eat a healthy diet  Be sure to include plenty of vegetables, fruits, low-fat dairy products, and lean protein.  Do not eat a lot of foods high in solid fats, added sugars, or salt.  Get regular exercise. This is one of the most important things you can do for your health.  Most adults should exercise for at least 150 minutes each week. The exercise should increase your heart rate and make you sweat (moderate-intensity exercise).  Most adults should also do strengthening exercises at least twice a week. This is in addition to the moderate-intensity exercise.  Maintain a healthy weight  Body mass index (BMI) is a measurement that can be used to identify possible weight problems. It estimates body fat based on height and weight. Your health care provider can help determine your BMI and help you achieve or maintain a healthy weight.  For females 32  years of age and older:   A BMI below 18.5 is considered underweight.  A BMI of 18.5 to 24.9 is normal.  A BMI of 25 to 29.9 is considered overweight.  A BMI of 30 and above is considered obese.  Watch levels of cholesterol and blood lipids  You should start having your blood tested for lipids and cholesterol at 41 years of age, then have this test every 5 years.  You may need to have your cholesterol levels checked more often if:  Your lipid or cholesterol levels are high.  You are older than 41 years of age.  You are at high risk for heart disease.  CANCER SCREENING   Lung Cancer  Lung cancer screening is recommended for adults 65-59 years old who are at high risk for lung cancer because of a history of smoking.  A yearly low-dose CT scan of the lungs is recommended for people who:  Currently smoke.  Have quit within the past 15 years.  Have at least a 30-pack-year history of smoking. A pack year is smoking an average of one pack of cigarettes a day for 1 year.  Yearly screening should continue until it has been 15 years since you quit.  Yearly screening should stop if you develop a health problem that would prevent you from having lung cancer treatment.  Breast Cancer  Practice breast self-awareness. This means understanding how your breasts normally appear and feel.  It also means  doing regular breast self-exams. Let your health care provider know about any changes, no matter how small.  If you are in your 20s or 30s, you should have a clinical breast exam (CBE) by a health care provider every 1-3 years as part of a regular health exam.  If you are 61 or older, have a CBE every year. Also consider having a breast X-ray (mammogram) every year.  If you have a family history of breast cancer, talk to your health care provider about genetic screening.  If you are at high risk for breast cancer, talk to your health care provider about having an MRI and a mammogram  every year.  Breast cancer gene (BRCA) assessment is recommended for women who have family members with BRCA-related cancers. BRCA-related cancers include:  Breast.  Ovarian.  Tubal.  Peritoneal cancers.  Results of the assessment will determine the need for genetic counseling and BRCA1 and BRCA2 testing. Cervical Cancer Routine pelvic examinations to screen for cervical cancer are no longer recommended for nonpregnant women who are considered low risk for cancer of the pelvic organs (ovaries, uterus, and vagina) and who do not have symptoms. A pelvic examination may be necessary if you have symptoms including those associated with pelvic infections. Ask your health care provider if a screening pelvic exam is right for you.   The Pap test is the screening test for cervical cancer for women who are considered at risk.  If you had a hysterectomy for a problem that was not cancer or a condition that could lead to cancer, then you no longer need Pap tests.  If you are older than 65 years, and you have had normal Pap tests for the past 10 years, you no longer need to have Pap tests.  If you have had past treatment for cervical cancer or a condition that could lead to cancer, you need Pap tests and screening for cancer for at least 20 years after your treatment.  If you no longer get a Pap test, assess your risk factors if they change (such as having a new sexual partner). This can affect whether you should start being screened again.  Some women have medical problems that increase their chance of getting cervical cancer. If this is the case for you, your health care provider may recommend more frequent screening and Pap tests.  The human papillomavirus (HPV) test is another test that may be used for cervical cancer screening. The HPV test looks for the virus that can cause cell changes in the cervix. The cells collected during the Pap test can be tested for HPV.  The HPV test can be used to  screen women 69 years of age and older. Getting tested for HPV can extend the interval between normal Pap tests from three to five years.  An HPV test also should be used to screen women of any age who have unclear Pap test results.  After 41 years of age, women should have HPV testing as often as Pap tests.  Colorectal Cancer  This type of cancer can be detected and often prevented.  Routine colorectal cancer screening usually begins at 41 years of age and continues through 41 years of age.  Your health care provider may recommend screening at an earlier age if you have risk factors for colon cancer.  Your health care provider may also recommend using home test kits to check for hidden blood in the stool.  A small camera at the end of a  tube can be used to examine your colon directly (sigmoidoscopy or colonoscopy). This is done to check for the earliest forms of colorectal cancer.  Routine screening usually begins at age 34.  Direct examination of the colon should be repeated every 5-10 years through 42 years of age. However, you may need to be screened more often if early forms of precancerous polyps or small growths are found. Skin Cancer  Check your skin from head to toe regularly.  Tell your health care provider about any new moles or changes in moles, especially if there is a change in a mole's shape or color.  Also tell your health care provider if you have a mole that is larger than the size of a pencil eraser.  Always use sunscreen. Apply sunscreen liberally and repeatedly throughout the day.  Protect yourself by wearing long sleeves, pants, a wide-brimmed hat, and sunglasses whenever you are outside. HEART DISEASE, DIABETES, AND HIGH BLOOD PRESSURE   Have your blood pressure checked at least every 1-2 years. High blood pressure causes heart disease and increases the risk of stroke.  If you are between 68 years and 46 years old, ask your health care provider if you should  take aspirin to prevent strokes.  Have regular diabetes screenings. This involves taking a blood sample to check your fasting blood sugar level.  If you are at a normal weight and have a low risk for diabetes, have this test once every three years after 41 years of age.  If you are overweight and have a high risk for diabetes, consider being tested at a younger age or more often. PREVENTING INFECTION  Hepatitis B  If you have a higher risk for hepatitis B, you should be screened for this virus. You are considered at high risk for hepatitis B if:  You were born in a country where hepatitis B is common. Ask your health care provider which countries are considered high risk.  Your parents were born in a high-risk country, and you have not been immunized against hepatitis B (hepatitis B vaccine).  You have HIV or AIDS.  You use needles to inject street drugs.  You live with someone who has hepatitis B.  You have had sex with someone who has hepatitis B.  You get hemodialysis treatment.  You take certain medicines for conditions, including cancer, organ transplantation, and autoimmune conditions. Hepatitis C  Blood testing is recommended for:  Everyone born from [birth year] through 1967.  Anyone with known risk factors for hepatitis C. Sexually transmitted infections (STIs)  You should be screened for sexually transmitted infections (STIs) including gonorrhea and chlamydia if:  You are sexually active and are younger than 41 years of age.  You are older than 41 years of age and your health care provider tells you that you are at risk for this type of infection.  Your sexual activity has changed since you were last screened and you are at an increased risk for chlamydia or gonorrhea. Ask your health care provider if you are at risk.  If you do not have HIV, but are at risk, it may be recommended that you take a prescription medicine daily to prevent HIV infection. This is called  pre-exposure prophylaxis (PrEP). You are considered at risk if:  You are sexually active and do not regularly use condoms or know the HIV status of your partner(s).  You take drugs by injection.  You are sexually active with a partner who has HIV. Talk with  your health care provider about whether you are at high risk of being infected with HIV. If you choose to begin PrEP, you should first be tested for HIV. You should then be tested every 3 months for as long as you are taking PrEP.  PREGNANCY   If you are premenopausal and you may become pregnant, ask your health care provider about preconception counseling.  If you may become pregnant, take 400 to 800 micrograms (mcg) of folic acid every day.  If you want to prevent pregnancy, talk to your health care provider about birth control (contraception). OSTEOPOROSIS AND MENOPAUSE   Osteoporosis is a disease in which the bones lose minerals and strength with aging. This can result in serious bone fractures. Your risk for osteoporosis can be identified using a bone density scan.  If you are 59 years of age or older, or if you are at risk for osteoporosis and fractures, ask your health care provider if you should be screened.  Ask your health care provider whether you should take a calcium or vitamin D supplement to lower your risk for osteoporosis.  Menopause may have certain physical symptoms and risks.  Hormone replacement therapy may reduce some of these symptoms and risks. Talk to your health care provider about whether hormone replacement therapy is right for you.  HOME CARE INSTRUCTIONS   Schedule regular health, dental, and eye exams.  Stay current with your immunizations.   Do not use any tobacco products including cigarettes, chewing tobacco, or electronic cigarettes.  If you are pregnant, do not drink alcohol.  If you are breastfeeding, limit how much and how often you drink alcohol.  Limit alcohol intake to no more than 1  drink per day for nonpregnant women. One drink equals 12 ounces of beer, 5 ounces of wine, or 1 ounces of hard liquor.  Do not use street drugs.  Do not share needles.  Ask your health care provider for help if you need support or information about quitting drugs.  Tell your health care provider if you often feel depressed.  Tell your health care provider if you have ever been abused or do not feel safe at home. Document Released: 12/04/2010 Document Revised: 10/05/2013 Document Reviewed: 04/22/2013 [facility] Patient Information 2015 [place], [place]. This information is not intended to replace advice given to you by your health care provider. Make sure you discuss any questions you have with your health care provider.

## 2014-09-26 ENCOUNTER — Encounter: Payer: Self-pay | Admitting: Internal Medicine

## 2014-09-26 DIAGNOSIS — Z0001 Encounter for general adult medical examination with abnormal findings: Secondary | ICD-10-CM | POA: Insufficient documentation

## 2014-09-26 DIAGNOSIS — Z0000 Encounter for general adult medical examination without abnormal findings: Secondary | ICD-10-CM | POA: Insufficient documentation

## 2014-09-26 DIAGNOSIS — K5900 Constipation, unspecified: Secondary | ICD-10-CM | POA: Insufficient documentation

## 2014-09-26 NOTE — Assessment & Plan Note (Signed)
Annual  wellness  exam was done as well as a comprehensive physical exam and management of acute and chronic conditions .  During the course of the visit the patient was educated and counseled about appropriate screening and preventive services including : fall prevention , diabetes screening, nutrition counseling, colorectal cancer screening, and recommended immunizations.  Printed recommendations for health maintenance screenings was given.  

## 2014-09-26 NOTE — Assessment & Plan Note (Signed)
Recommended an increase in fiber intake to 25 to 35 grams daily.  Made several dietary suggestions of high fiber content including Mission low carb whole wheat tortillas which have 26 g fiber/serving, Toufayan flatbread which has around 10 g fiber,   Daily serving of any type of nuts,  and Atkins protein bars which have 8 to 10 g fiber.   Dispelled the popularly held notion that Quaker oats oatmeal, whole grain bread and most commericially made cereals have adequate fiber.  Finally I recommended daily use of Miralax., metamucil, citrucel, benefiber  or fibercon.  

## 2014-09-26 NOTE — Assessment & Plan Note (Signed)
I have addressed  BMI and recommended wt loss of 10% of body weigh over the next 6 months using a low glycemic index diet and regular exercise a minimum of 5 days per week.   

## 2014-09-26 NOTE — Assessment & Plan Note (Signed)
With recent malfunction now resolved.  Weight gain occurred, which she has addressed.

## 2014-09-30 ENCOUNTER — Other Ambulatory Visit (INDEPENDENT_AMBULATORY_CARE_PROVIDER_SITE_OTHER): Payer: Managed Care, Other (non HMO)

## 2014-09-30 DIAGNOSIS — E559 Vitamin D deficiency, unspecified: Secondary | ICD-10-CM

## 2014-09-30 DIAGNOSIS — E538 Deficiency of other specified B group vitamins: Secondary | ICD-10-CM

## 2014-09-30 DIAGNOSIS — R5383 Other fatigue: Secondary | ICD-10-CM | POA: Diagnosis not present

## 2014-09-30 DIAGNOSIS — E785 Hyperlipidemia, unspecified: Secondary | ICD-10-CM

## 2014-09-30 DIAGNOSIS — Z1159 Encounter for screening for other viral diseases: Secondary | ICD-10-CM

## 2014-09-30 DIAGNOSIS — D509 Iron deficiency anemia, unspecified: Secondary | ICD-10-CM | POA: Diagnosis not present

## 2014-09-30 LAB — CBC WITH DIFFERENTIAL/PLATELET
BASOS PCT: 0.6 % (ref 0.0–3.0)
Basophils Absolute: 0 10*3/uL (ref 0.0–0.1)
EOS ABS: 0.1 10*3/uL (ref 0.0–0.7)
Eosinophils Relative: 1.4 % (ref 0.0–5.0)
HCT: 39.2 % (ref 36.0–46.0)
HEMOGLOBIN: 13.3 g/dL (ref 12.0–15.0)
Lymphocytes Relative: 33.9 % (ref 12.0–46.0)
Lymphs Abs: 1.7 10*3/uL (ref 0.7–4.0)
MCHC: 33.9 g/dL (ref 30.0–36.0)
MCV: 87.2 fl (ref 78.0–100.0)
MONO ABS: 0.2 10*3/uL (ref 0.1–1.0)
Monocytes Relative: 4.5 % (ref 3.0–12.0)
NEUTROS ABS: 3 10*3/uL (ref 1.4–7.7)
NEUTROS PCT: 59.6 % (ref 43.0–77.0)
Platelets: 264 10*3/uL (ref 150.0–400.0)
RBC: 4.5 Mil/uL (ref 3.87–5.11)
RDW: 13.3 % (ref 11.5–15.5)
WBC: 5 10*3/uL (ref 4.0–10.5)

## 2014-09-30 LAB — COMPREHENSIVE METABOLIC PANEL
ALBUMIN: 4 g/dL (ref 3.5–5.2)
ALK PHOS: 30 U/L — AB (ref 39–117)
ALT: 11 U/L (ref 0–35)
AST: 12 U/L (ref 0–37)
BUN: 18 mg/dL (ref 6–23)
CALCIUM: 9.1 mg/dL (ref 8.4–10.5)
CHLORIDE: 105 meq/L (ref 96–112)
CO2: 29 mEq/L (ref 19–32)
Creatinine, Ser: 0.52 mg/dL (ref 0.40–1.20)
GFR: 138.31 mL/min (ref >60.00)
Glucose, Bld: 90 mg/dL (ref 70–99)
POTASSIUM: 4.3 meq/L (ref 3.5–5.1)
Sodium: 137 mEq/L (ref 135–145)
TOTAL PROTEIN: 6.1 g/dL (ref 6.0–8.3)
Total Bilirubin: 0.5 mg/dL (ref 0.2–1.2)

## 2014-09-30 LAB — LIPID PANEL
Cholesterol: 171 mg/dL (ref 0–200)
HDL: 42.9 mg/dL (ref >39.00)
LDL CALC: 109 mg/dL — AB (ref 0–99)
NONHDL: 128.1
Total CHOL/HDL Ratio: 4
Triglycerides: 94 mg/dL (ref 0.0–149.0)
VLDL: 18.8 mg/dL (ref 0.0–40.0)

## 2014-09-30 LAB — TSH: TSH: 1.32 u[IU]/mL (ref 0.35–4.50)

## 2014-09-30 LAB — IBC PANEL
IRON: 99 ug/dL (ref 42–145)
SATURATION RATIOS: 33.4 % (ref 20.0–50.0)
Transferrin: 212 mg/dL (ref 212.0–360.0)

## 2014-09-30 LAB — VITAMIN D 25 HYDROXY (VIT D DEFICIENCY, FRACTURES): VITD: 19.22 ng/mL — ABNORMAL LOW (ref 30.00–100.00)

## 2014-09-30 LAB — VITAMIN B12: Vitamin B-12: 202 pg/mL — ABNORMAL LOW (ref 211–911)

## 2014-10-01 LAB — HEPATITIS C ANTIBODY: HCV AB: NEGATIVE

## 2014-10-03 ENCOUNTER — Other Ambulatory Visit: Payer: Self-pay | Admitting: Internal Medicine

## 2014-10-03 ENCOUNTER — Encounter: Payer: Self-pay | Admitting: Internal Medicine

## 2014-10-03 MED ORDER — ERGOCALCIFEROL 1.25 MG (50000 UT) PO CAPS: 50000.0000 [IU] | ORAL_CAPSULE | ORAL | Status: DC

## 2014-10-04 NOTE — Telephone Encounter (Signed)
Pt called states she is wanting the Sublingual B12.  Please advise

## 2015-02-11 ENCOUNTER — Ambulatory Visit (INDEPENDENT_AMBULATORY_CARE_PROVIDER_SITE_OTHER): Payer: Managed Care, Other (non HMO) | Admitting: Family Medicine

## 2015-02-11 ENCOUNTER — Encounter: Payer: Self-pay | Admitting: Family Medicine

## 2015-02-11 VITALS — BP 118/74 | HR 80 | Temp 98.6°F | Ht 65.25 in | Wt 179.5 lb

## 2015-02-11 DIAGNOSIS — J0100 Acute maxillary sinusitis, unspecified: Secondary | ICD-10-CM | POA: Insufficient documentation

## 2015-02-11 DIAGNOSIS — J0110 Acute frontal sinusitis, unspecified: Secondary | ICD-10-CM

## 2015-02-11 DIAGNOSIS — J019 Acute sinusitis, unspecified: Secondary | ICD-10-CM | POA: Insufficient documentation

## 2015-02-11 MED ORDER — BENZONATATE 100 MG PO CAPS: 100.0000 mg | ORAL_CAPSULE | Freq: Two times a day (BID) | ORAL | Status: DC | PRN

## 2015-02-11 MED ORDER — AZITHROMYCIN 250 MG PO TABS: ORAL_TABLET | ORAL | Status: DC

## 2015-02-11 NOTE — Progress Notes (Signed)
Patient ID: Stephanie Boyd, female   DOB: May 12, 1976, 39 y.o.   MRN: [id number]  Marikay Alar, MD Phone: [phone]  Stephanie Boyd is a 39 y.o. female who presents today for same day visit.  Sinus infection: patient seen at [facility] walk in clinic on Sunday and found to have AOM and sinus infection. She notes that her ears have resolved, though she continues to have sinus pressure in frontal sinuses. She has developed cough with this intermittently porductive fo yellow mucus. She is blowing clear mucus out of her nose. She notes chills on Sunday, though none since then. She notes the sinus pressure has only improved minimally with doxycycline. She denies dyspnea and chest pain with this. Has been using steroid nasal spray. Has history of this and has responded well to z-pack in the past. Has history of migraine though this feels more like her sinus issues.   PMH: nonsmoker.    ROS see HPI  Objective  Physical Exam Filed Vitals:   02/11/15 1011  BP: 118/74  Pulse: 80  Temp: 98.6 F (37 C)    Physical Exam  Constitutional: She is well-developed, well-nourished, and in no distress.  HENT:  Head: Normocephalic and atraumatic.  Right Ear: External ear normal.  Left Ear: External ear normal.  Mouth/Throat: Oropharynx is clear and moist. No oropharyngeal exudate.  Normal TMs bilaterally, no sinus tenderness  Eyes: Conjunctivae are normal. Pupils are equal, round, and reactive to light.  Neck: Neck supple.  Cardiovascular: Normal rate, regular rhythm and normal heart sounds.  Exam reveals no gallop and no friction rub.   No murmur heard. Pulmonary/Chest: Breath sounds normal. No respiratory distress. She has no wheezes. She has no rales.  Lymphadenopathy:    She has no cervical adenopathy.  Neurological: She is alert.  CN 2-12 intact, 5/5 strength in bilateral biceps, triceps, grip, quads, hamstrings, plantar and dorsiflexion, sensation to light touch intact in bilateral UE and  LE, normal gait, 2+ patellar reflexes  Skin: Skin is warm and dry. She is not diaphoretic.     Assessment/Plan: Please see individual problem list.  Sinusitis, acute Patient with persistent sinusitis symptoms despite 5 days of doxycycline therapy. Her ear infection has resolved on exam, though sinus pressure persists. Possibly some component of bronchitis as well. She is well appearing with stable vitals at this time. Given persistent sinus pressure despite antibiotic therapy will change therapy to azithromycin as she has responded well to this in the past. D/c doxy. Start tessalon. Stop previously prescribed cough suppressant. Advised to stay well hydrated and rest. Given return precautions.     Marikay Alar

## 2015-02-11 NOTE — Progress Notes (Signed)
Pre visit review using our clinic review tool, if applicable. No additional management support is needed unless otherwise documented below in the visit note. 

## 2015-02-11 NOTE — Assessment & Plan Note (Addendum)
Patient with persistent sinusitis symptoms despite 5 days of doxycycline therapy. Her ear infection has resolved on exam, though sinus pressure persists. Possibly some component of bronchitis as well. She is well appearing with stable vitals at this time. Given persistent sinus pressure despite antibiotic therapy will change therapy to azithromycin as she has responded well to this in the past. D/c doxy. Start tessalon. Stop previously prescribed cough suppressant. Advised to stay well hydrated and rest. Given return precautions.

## 2015-02-11 NOTE — Patient Instructions (Signed)
Nice to meet you. We will change your antibiotic to azithromycin to help treat your sinus infection.  I also sent in a cough medication. If you develop headaches, numbness, weakness, fever, shortness of breath, chest pain, or ear pain please seek medical attention.

## 2015-12-04 ENCOUNTER — Encounter: Payer: Self-pay | Admitting: Internal Medicine

## 2015-12-05 NOTE — Telephone Encounter (Signed)
Monday 12/12/15 @ 2.30 Dr. Darrick Huntsmanullo, ok below is the message sent by patient wife. Date and time ok I have sent my chart with date and time  After 21 years of being in a wheelchair, my husband Stephanie Boyd, has his first pressure sore. We discovered it last night after trying to investigate why his legs are spasm'ing so much. It is on his left buttock. Looking online at the 4 stages of pressure sores, it looks to me like it is definitely a 2 and maybe a 3. We are out of town this week. If by chance you can see him on Friday, July 7, we will return from our trip early. My main concern is after this week, we will be home one full week before leaving for [place] for 3 weeks. We fly out July 17th. That's long plane rides and 3 weeks of being away from our medical providers.   Can you tell me your thoughts on what we should do? His number is [phone]. DOB [birth year] ok below is the message sent by patient wife.

## 2015-12-07 NOTE — Telephone Encounter (Signed)
FYI patient has canceled on the pressure ulcer.

## 2016-05-01 ENCOUNTER — Other Ambulatory Visit: Payer: Self-pay | Admitting: Obstetrics & Gynecology

## 2016-05-01 DIAGNOSIS — Z1231 Encounter for screening mammogram for malignant neoplasm of breast: Secondary | ICD-10-CM

## 2016-05-01 LAB — HM PAP SMEAR: HM Pap smear: NEGATIVE

## 2016-06-07 ENCOUNTER — Ambulatory Visit: Payer: Managed Care, Other (non HMO)

## 2016-07-10 ENCOUNTER — Ambulatory Visit
Admission: RE | Admit: 2016-07-10 | Discharge: 2016-07-10 | Disposition: A | Discharge status: Home / Self Care | Payer: 59 | Source: Ambulatory Visit | Attending: Obstetrics & Gynecology | Admitting: Obstetrics & Gynecology

## 2016-07-10 DIAGNOSIS — Z1231 Encounter for screening mammogram for malignant neoplasm of breast: Secondary | ICD-10-CM | POA: Insufficient documentation

## 2016-07-10 LAB — HM MAMMOGRAPHY

## 2016-07-18 ENCOUNTER — Other Ambulatory Visit: Payer: Self-pay | Admitting: *Deleted

## 2016-07-18 ENCOUNTER — Inpatient Hospital Stay
Admission: RE | Admit: 2016-07-18 | Discharge: 2016-07-18 | Disposition: A | Discharge status: Home / Self Care | Payer: Self-pay | Source: Ambulatory Visit | Attending: *Deleted | Admitting: *Deleted

## 2016-07-18 DIAGNOSIS — Z9289 Personal history of other medical treatment: Secondary | ICD-10-CM

## 2016-09-10 ENCOUNTER — Encounter: Payer: Self-pay | Admitting: Family

## 2016-09-10 ENCOUNTER — Ambulatory Visit (INDEPENDENT_AMBULATORY_CARE_PROVIDER_SITE_OTHER): Payer: 59 | Admitting: Family

## 2016-09-10 VITALS — BP 104/86 | HR 74 | Temp 98.2°F | Ht 65.25 in | Wt 183.2 lb

## 2016-09-10 DIAGNOSIS — R0981 Nasal congestion: Secondary | ICD-10-CM | POA: Diagnosis not present

## 2016-09-10 NOTE — Progress Notes (Signed)
Pre visit review using our clinic review tool, if applicable. No additional management support is needed unless otherwise documented below in the visit note. 

## 2016-09-10 NOTE — Progress Notes (Signed)
Subjective:    Patient ID: Stephanie Boyd, female    DOB: 07/30/1975, 41 y.o.   MRN: [id number]  CC: Stephanie Boyd is a 41 y.o. female who presents today for an acute visit.    HPI: CC: thick sinus congestion 5 days, some improvement.   Endorses post nasal drip, dry cough, chills, tactile warmth. Tried zyrtec , decongestant without relief.   No SOB, wheezing, ear pain, severe HA, vision changes.   No lung disease.       HISTORY:  Past Medical History:  Diagnosis Date  . Allergic rhinitis   . Heart murmur   . History of chicken pox   . Migraines    Past Surgical History:  Procedure Laterality Date  . ABDOMINOPLASTY N/A July 2015  . LAMINECTOMY  11/2008   L4-5  . lapband  08/2009   and gastric plication  . TUBAL LIGATION  2007  . UMBILICAL HERNIA REPAIR  01/2007   Family History  Problem Relation Age of Onset  . Hypertension Mother   . Diabetes Mother   . Hypertension Father   . Cancer Father     melonoma  . Heart disease Father     A-fib  . Breast cancer Neg Hx     Allergies: Hydrocodone-acetaminophen; Penicillins; Sulfonamide derivatives; and Vancomycin No current outpatient prescriptions on file prior to visit.   No current facility-administered medications on file prior to visit.     Social History  Substance Use Topics  . Smoking status: Never Smoker  . Smokeless tobacco: Never Used  . Alcohol use Yes     Comment: rarely    Review of Systems  Constitutional: Negative for chills and fever.  HENT: Positive for congestion and postnasal drip. Negative for ear pain, sinus pressure and sneezing.   Eyes: Negative for visual disturbance.  Respiratory: Positive for cough. Negative for shortness of breath and wheezing.   Cardiovascular: Negative for chest pain and palpitations.  Gastrointestinal: Negative for nausea and vomiting.  Neurological: Negative for headaches.      Objective:    BP 104/86   Pulse 74   Temp 98.2 F (36.8 C) (Oral)   Ht  5' 5.25" (1.657 m)   Wt 183 lb 3.2 oz (83.1 kg)   LMP 09/06/2016 (Exact Date)   SpO2 98%   BMI 30.25 kg/m    Physical Exam  Constitutional: She appears well-developed and well-nourished.  HENT:  Head: Normocephalic and atraumatic.  Right Ear: Hearing, tympanic membrane, external ear and ear canal normal. No drainage, swelling or tenderness. No foreign bodies. Tympanic membrane is not erythematous and not bulging. No middle ear effusion. No decreased hearing is noted.  Left Ear: Hearing, tympanic membrane, external ear and ear canal normal. No drainage, swelling or tenderness. No foreign bodies. Tympanic membrane is not erythematous and not bulging.  No middle ear effusion. No decreased hearing is noted.  Nose: Rhinorrhea present. Right sinus exhibits no maxillary sinus tenderness and no frontal sinus tenderness. Left sinus exhibits no maxillary sinus tenderness and no frontal sinus tenderness.  Mouth/Throat: Uvula is midline, oropharynx is clear and moist and mucous membranes are normal. No oropharyngeal exudate, posterior oropharyngeal edema, posterior oropharyngeal erythema or tonsillar abscesses.  Eyes: Conjunctivae are normal.  Cardiovascular: Regular rhythm, normal heart sounds and normal pulses.   Pulmonary/Chest: Effort normal and breath sounds normal. She has no wheezes. She has no rhonchi. She has no rales.  Lymphadenopathy:       Head (right side): No  submental, no submandibular, no tonsillar, no preauricular, no posterior auricular and no occipital adenopathy present.       Head (left side): No submental, no submandibular, no tonsillar, no preauricular, no posterior auricular and no occipital adenopathy present.    She has no cervical adenopathy.  Neurological: She is alert.  Skin: Skin is warm and dry.  Psychiatric: She has a normal mood and affect. Her speech is normal and behavior is normal. Thought content normal.  Vitals reviewed.      Assessment & Plan:   1. Sinus  congestion Afebrile. Well appearing. Some improvement. Working diagnosis of viral URI x 5 days. Patient and I agreed upon conservative therapy at this time with symptom management, close observation, and delayed antibiotic treatment. We discussed at length her OTC regimen of which she was not on the best regimen for symptoms- advised stopping decongestant and antihistamine. Start delsym and mucinex. Will let me know if not better.      I have discontinued Ms. Brandon's ALPRAZolam, fluticasone, antipyrine-benzocaine, Azelastine-Fluticasone, ergocalciferol, brompheniramine-pseudoephedrine-DM, azithromycin, and benzonatate.   No orders of the defined types were placed in this encounter.   Return precautions given.   Risks, benefits, and alternatives of the medications and treatment plan prescribed today were discussed, and patient expressed understanding.   Education regarding symptom management and diagnosis given to patient on AVS.  Continue to follow with TULLO, Mar Daring, MD for routine health maintenance.   Shashana N Wageman and I agreed with plan.   Rennie Plowman, FNP

## 2016-09-10 NOTE — Patient Instructions (Signed)
As discussed, lets try conservative therapy first.   Delsym   Mucinex with plenty water  Let me know if you don't improve

## 2016-09-11 ENCOUNTER — Telehealth: Payer: Self-pay | Admitting: *Deleted

## 2016-09-11 DIAGNOSIS — J0110 Acute frontal sinusitis, unspecified: Secondary | ICD-10-CM

## 2016-09-11 NOTE — Telephone Encounter (Signed)
Patient was seen in the office on 04/09 for a sinus infection. She was advised to call office if her symptoms worsen. She now has green mucus and congestion. She has requested to have a antibiotic called into the pharmacy. Pharmacy [facility] [address]  Pt contact [phone]

## 2016-09-11 NOTE — Telephone Encounter (Signed)
Reason for call:sinus infection Symptoms: head congestion, drainage down back of throat, cough persistent, ear pain bilateral  Duration 4/5 Medications:,Mucinex hasn't started Delsym Last seen for this problem:09/10/16 Seen WU:[id number] Arnett  Please advise ,  Pharmacy [facility] [address].

## 2016-09-12 MED ORDER — DOXYCYCLINE HYCLATE 100 MG PO TABS: 100.0000 mg | ORAL_TABLET | Freq: Two times a day (BID) | ORAL | 0 refills | Status: DC

## 2016-09-12 NOTE — Telephone Encounter (Signed)
Left voice mail to call back 

## 2016-09-12 NOTE — Telephone Encounter (Signed)
Pt called back returning your call. Thank you!  Call pt @ [phone]

## 2016-09-12 NOTE — Telephone Encounter (Signed)
Patient advised of below and verbalized an understanding  

## 2016-09-12 NOTE — Telephone Encounter (Signed)
Call pt-  abx sent  Advise probiotics during and 2 weeks after antibiotic.  Let us know if not better  Do not get out for extended periods on this medication as more at risk for sun burn

## 2017-05-02 ENCOUNTER — Ambulatory Visit: Payer: Self-pay | Admitting: Obstetrics & Gynecology

## 2017-05-06 ENCOUNTER — Encounter: Payer: Self-pay | Admitting: Obstetrics & Gynecology

## 2017-05-06 ENCOUNTER — Ambulatory Visit (INDEPENDENT_AMBULATORY_CARE_PROVIDER_SITE_OTHER): Payer: 59 | Admitting: Obstetrics & Gynecology

## 2017-05-06 VITALS — BP 120/80 | HR 68 | Ht 65.0 in | Wt 186.0 lb

## 2017-05-06 DIAGNOSIS — Z124 Encounter for screening for malignant neoplasm of cervix: Secondary | ICD-10-CM | POA: Diagnosis not present

## 2017-05-06 DIAGNOSIS — Z131 Encounter for screening for diabetes mellitus: Secondary | ICD-10-CM

## 2017-05-06 DIAGNOSIS — Z1231 Encounter for screening mammogram for malignant neoplasm of breast: Secondary | ICD-10-CM

## 2017-05-06 DIAGNOSIS — Z01419 Encounter for gynecological examination (general) (routine) without abnormal findings: Secondary | ICD-10-CM

## 2017-05-06 DIAGNOSIS — Z1329 Encounter for screening for other suspected endocrine disorder: Secondary | ICD-10-CM | POA: Diagnosis not present

## 2017-05-06 DIAGNOSIS — Z1321 Encounter for screening for nutritional disorder: Secondary | ICD-10-CM | POA: Diagnosis not present

## 2017-05-06 DIAGNOSIS — Z13220 Encounter for screening for lipoid disorders: Secondary | ICD-10-CM

## 2017-05-06 DIAGNOSIS — E669 Obesity, unspecified: Secondary | ICD-10-CM

## 2017-05-06 DIAGNOSIS — Z1239 Encounter for other screening for malignant neoplasm of breast: Secondary | ICD-10-CM

## 2017-05-06 DIAGNOSIS — Z0000 Encounter for general adult medical examination without abnormal findings: Secondary | ICD-10-CM

## 2017-05-06 NOTE — Patient Instructions (Signed)
PAP every years Mammogram every year    Call [phone] to [facility] at [facility] soon

## 2017-05-06 NOTE — Progress Notes (Signed)
HPI:      [name] is a 41 y.o. G2P2002 who LMP was Patient's last menstrual period was 04/27/2017., she presents today for her annual examination. The patient has no complaints today. The patient is sexually active. Her last pap: approximate date 2017 and was normal (prior abn 2014) and last mammogram: approximate date 07/2016 and was normal. The patient does perform self breast exams.  There is no notable family history of breast or ovarian cancer in her family.  The patient has regular exercise: yes.  The patient denies current symptoms of depression.    GYN History: Contraception: tubal ligation  PMHx: Past Medical History:  Diagnosis Date  . Allergic rhinitis   . Heart murmur   . History of chicken pox   . Menorrhagia   . Migraines   . Urinary frequency    Past Surgical History:  Procedure Laterality Date  . ABDOMINOPLASTY N/A July 2015  . ABLATION    . HERNIA REPAIR    . LAMINECTOMY  11/2008   L4-5  . lapband  08/2009   and gastric plication  . TUBAL LIGATION  2007  . UMBILICAL HERNIA REPAIR  01/2007   Family History  Problem Relation Age of Onset  . Hypertension Mother   . Diabetes Mother   . Hypertension Father   . Cancer Father        melonoma  . Heart disease Father        A-fib  . Breast cancer Neg Hx    Social History   Tobacco Use  . Smoking status: Never Smoker  . Smokeless tobacco: Never Used  Substance Use Topics  . Alcohol use: Yes    Comment: rarely  . Drug use: No    Current Outpatient Medications:  .  doxycycline (VIBRA-TABS) 100 MG tablet, Take 1 tablet (100 mg total) by mouth 2 (two) times daily., Disp: 14 tablet, Rfl: 0 Allergies: Hydrocodone-acetaminophen; Penicillins; Sulfonamide derivatives; and Vancomycin  Review of Systems  Constitutional: Negative for chills, fever and malaise/fatigue.  HENT: Negative for congestion, sinus pain and sore throat.   Eyes: Negative for blurred vision and pain.  Respiratory: Negative for  cough and wheezing.   Cardiovascular: Negative for chest pain and leg swelling.  Gastrointestinal: Negative for abdominal pain, constipation, diarrhea, heartburn, nausea and vomiting.  Genitourinary: Negative for dysuria, frequency, hematuria and urgency.  Musculoskeletal: Negative for back pain, joint pain, myalgias and neck pain.  Skin: Negative for itching and rash.  Neurological: Negative for dizziness, tremors and weakness.  Endo/Heme/Allergies: Does not bruise/bleed easily.  Psychiatric/Behavioral: Negative for depression. The patient is not nervous/anxious and does not have insomnia.     Objective: BP 120/80   Pulse 68   Ht 5\' 5"  (1.651 m)   Wt 186 lb (84.4 kg)   LMP 04/27/2017   BMI 30.95 kg/m   Filed Weights   05/06/17 1127  Weight: 186 lb (84.4 kg)   Body mass index is 30.95 kg/m. Physical Exam  Constitutional: She is oriented to person, place, and time. She appears well-developed and well-nourished. No distress.  Genitourinary: Rectum normal, vagina normal and uterus normal. Pelvic exam was performed with patient supine. There is no rash or lesion on the right labia. There is no rash or lesion on the left labia. Vagina exhibits no lesion. No bleeding in the vagina. Right adnexum does not display mass and does not display tenderness. Left adnexum does not display mass and does not display tenderness. Cervix does  not exhibit motion tenderness, lesion, friability or polyp.   Uterus is mobile and midaxial. Uterus is not enlarged or exhibiting a mass.  HENT:  Head: Normocephalic and atraumatic. Head is without laceration.  Right Ear: Hearing normal.  Left Ear: Hearing normal.  Nose: No epistaxis.  No foreign bodies.  Mouth/Throat: Uvula is midline, oropharynx is clear and moist and mucous membranes are normal.  Eyes: Pupils are equal, round, and reactive to light.  Neck: Normal range of motion. Neck supple. No thyromegaly present.  Cardiovascular: Normal rate and regular  rhythm. Exam reveals no gallop and no friction rub.  No murmur heard. Pulmonary/Chest: Effort normal and breath sounds normal. No respiratory distress. She has no wheezes. Right breast exhibits no mass, no skin change and no tenderness. Left breast exhibits no mass, no skin change and no tenderness.  Abdominal: Soft. Bowel sounds are normal. She exhibits no distension. There is no tenderness. There is no rebound.  Musculoskeletal: Normal range of motion.  Neurological: She is alert and oriented to person, place, and time. No cranial nerve deficit.  Skin: Skin is warm and dry.  Psychiatric: She has a normal mood and affect. Judgment normal.  Vitals reviewed.   Assessment:  ANNUAL EXAM 1. Annual physical exam   2. Obesity (BMI 30-39.9)   3. Screening for cervical cancer   4. Screening for breast cancer   5. Screening for thyroid disorder   6. Screening for cholesterol level   7. Encounter for vitamin deficiency screening   8. Screening for diabetes mellitus      Screening Plan:            1.  Cervical Screening-  Pap smear done today  2. Breast screening- Exam annually and mammogram>40 planned   3. Colonoscopy every 10 years, Hemoccult testing - after age 43  4. Labs To return fasting at a later date  5. Counseling for contraception: bilateral tubal ligation    F/U  Return in about 1 year (around 05/06/2018) for Annual and for LABS one morning.  Annamarie MajorPaul Teleshia Lemere, MD, Merlinda FrederickFACOG [facility], [facility] 05/06/2017  11:55 AM

## 2017-05-08 LAB — IGP, APTIMA HPV
HPV Aptima: NEGATIVE
PAP Smear Comment: 0

## 2017-05-14 ENCOUNTER — Other Ambulatory Visit: Payer: 59

## 2017-05-21 ENCOUNTER — Other Ambulatory Visit: Payer: 59

## 2017-05-21 DIAGNOSIS — Z1321 Encounter for screening for nutritional disorder: Secondary | ICD-10-CM

## 2017-05-21 DIAGNOSIS — Z131 Encounter for screening for diabetes mellitus: Secondary | ICD-10-CM

## 2017-05-21 DIAGNOSIS — Z1329 Encounter for screening for other suspected endocrine disorder: Secondary | ICD-10-CM

## 2017-05-21 DIAGNOSIS — Z13220 Encounter for screening for lipoid disorders: Secondary | ICD-10-CM | POA: Diagnosis not present

## 2017-05-22 LAB — LIPID PANEL
CHOLESTEROL TOTAL: 165 mg/dL (ref 100–199)
Chol/HDL Ratio: 4 ratio (ref 0.0–4.4)
HDL: 41 mg/dL (ref >39)
LDL Calculated: 107 mg/dL — ABNORMAL HIGH (ref 0–99)
TRIGLYCERIDES: 86 mg/dL (ref 0–149)
VLDL Cholesterol Cal: 17 mg/dL (ref 5–40)

## 2017-05-22 LAB — VITAMIN B12: VITAMIN B 12: 1524 pg/mL — AB (ref 232–1245)

## 2017-05-22 LAB — GLUCOSE, FASTING: Glucose, Plasma: 90 mg/dL (ref 65–99)

## 2017-05-22 LAB — TSH: TSH: 1.62 u[IU]/mL (ref 0.450–4.500)

## 2017-05-22 LAB — VITAMIN D 25 HYDROXY (VIT D DEFICIENCY, FRACTURES): Vit D, 25-Hydroxy: 23.8 ng/mL — ABNORMAL LOW (ref 30.0–100.0)

## 2017-06-19 DIAGNOSIS — H524 Presbyopia: Secondary | ICD-10-CM | POA: Diagnosis not present

## 2017-09-10 ENCOUNTER — Telehealth: Payer: Self-pay

## 2017-09-10 NOTE — Telephone Encounter (Signed)
-----   Message from Nadara Mustardobert P Harris, MD sent at 09/10/2017  8:45 AM EDT ----- Regarding: MMG not done yet Received notice she has not received MMG yet as ordered at her Annual. Please check and encourage her to do this, and document conversation.

## 2017-09-10 NOTE — Telephone Encounter (Signed)
Called pt to remind her of mammo and she said she will wait til 2020 she does not want to do it yearly.

## 2017-09-25 DIAGNOSIS — R1311 Dysphagia, oral phase: Secondary | ICD-10-CM | POA: Diagnosis not present

## 2017-09-25 DIAGNOSIS — Z9884 Bariatric surgery status: Secondary | ICD-10-CM | POA: Diagnosis not present

## 2017-10-11 DIAGNOSIS — K9509 Other complications of gastric band procedure: Secondary | ICD-10-CM | POA: Insufficient documentation

## 2017-12-25 DIAGNOSIS — Z01818 Encounter for other preprocedural examination: Secondary | ICD-10-CM | POA: Diagnosis not present

## 2017-12-31 ENCOUNTER — Telehealth: Payer: Self-pay

## 2017-12-31 ENCOUNTER — Encounter: Payer: Self-pay | Admitting: Obstetrics & Gynecology

## 2017-12-31 NOTE — Telephone Encounter (Signed)
Pt is going to send [facility] a message through My Chart, shes having her lap band removed and needing some labs, chest u/s and EKG ,

## 2017-12-31 NOTE — Telephone Encounter (Signed)
[facility] regarding labwork. No other information was given.  CB# [phone]

## 2018-01-02 DIAGNOSIS — Z01818 Encounter for other preprocedural examination: Secondary | ICD-10-CM | POA: Diagnosis not present

## 2018-01-02 DIAGNOSIS — Z9884 Bariatric surgery status: Secondary | ICD-10-CM | POA: Diagnosis not present

## 2018-01-29 DIAGNOSIS — K219 Gastro-esophageal reflux disease without esophagitis: Secondary | ICD-10-CM | POA: Diagnosis not present

## 2018-01-29 DIAGNOSIS — R1311 Dysphagia, oral phase: Secondary | ICD-10-CM | POA: Diagnosis not present

## 2018-02-27 ENCOUNTER — Encounter: Payer: Self-pay | Admitting: Obstetrics & Gynecology

## 2018-02-27 ENCOUNTER — Other Ambulatory Visit: Payer: Self-pay | Admitting: Obstetrics & Gynecology

## 2018-03-04 ENCOUNTER — Other Ambulatory Visit: Payer: Self-pay | Admitting: Obstetrics & Gynecology

## 2018-03-04 DIAGNOSIS — Z1231 Encounter for screening mammogram for malignant neoplasm of breast: Secondary | ICD-10-CM

## 2018-03-07 ENCOUNTER — Ambulatory Visit
Admission: RE | Admit: 2018-03-07 | Discharge: 2018-03-07 | Disposition: A | Discharge status: Home / Self Care | Payer: 59 | Source: Ambulatory Visit | Attending: Obstetrics & Gynecology | Admitting: Obstetrics & Gynecology

## 2018-03-07 DIAGNOSIS — Z1231 Encounter for screening mammogram for malignant neoplasm of breast: Secondary | ICD-10-CM | POA: Diagnosis not present

## 2018-03-29 MED ORDER — SCOPOLAMINE 1 MG/3DAYS TD PT72
1.00 | MEDICATED_PATCH | TRANSDERMAL | Status: DC
Start: 2018-03-30 — End: 2018-03-29

## 2018-03-29 MED ORDER — PROMETHAZINE HCL 25 MG/ML IJ SOLN
6.25 | INTRAMUSCULAR | Status: DC
Start: ? — End: 2018-03-29

## 2018-03-29 MED ORDER — FAMOTIDINE PREMIXED 20-0.9 MG/50ML-% IV SOLN
20.00 | INTRAVENOUS | Status: DC
Start: 2018-03-29 — End: 2018-03-29

## 2018-03-29 MED ORDER — ENOXAPARIN SODIUM 40 MG/0.4ML ~~LOC~~ SOLN
40.00 | SUBCUTANEOUS | Status: DC
Start: 2018-03-30 — End: 2018-03-29

## 2018-03-29 MED ORDER — ACETAMINOPHEN 325 MG PO TABS
650.00 | ORAL_TABLET | ORAL | Status: DC
Start: 2018-03-29 — End: 2018-03-29

## 2018-03-29 MED ORDER — GENERIC EXTERNAL MEDICATION
30.00 | Status: DC
Start: ? — End: 2018-03-29

## 2018-03-29 MED ORDER — LACTATED RINGERS IV SOLN
INTRAVENOUS | Status: DC
Start: ? — End: 2018-03-29

## 2018-03-29 MED ORDER — ONDANSETRON 4 MG PO TBDP
4.00 | ORAL_TABLET | ORAL | Status: DC
Start: 2018-03-29 — End: 2018-03-29

## 2018-03-29 MED ORDER — GABAPENTIN 300 MG PO CAPS
300.00 | ORAL_CAPSULE | ORAL | Status: DC
Start: 2018-03-29 — End: 2018-03-29

## 2018-08-15 ENCOUNTER — Ambulatory Visit: Payer: 59 | Admitting: Internal Medicine

## 2018-09-01 ENCOUNTER — Telehealth: Payer: Self-pay

## 2018-09-01 NOTE — Telephone Encounter (Signed)
Left a message letting pt know that we have changed her appt to a virtual visit and she will be receiving an email.

## 2018-09-01 NOTE — Telephone Encounter (Signed)
Copied from CRM [id number]. Topic: Appointment Scheduling - Scheduling Inquiry for Clinic >> Sep 01, 2018  9:13 AM Angela Nevin wrote: Reason for CRM: Patient would like to set up webvisit for 4/2 appointment with Dr. Darrick Huntsman. Patients email address is correct in chart.

## 2018-09-04 ENCOUNTER — Ambulatory Visit (INDEPENDENT_AMBULATORY_CARE_PROVIDER_SITE_OTHER): Payer: 59 | Admitting: Internal Medicine

## 2018-09-04 DIAGNOSIS — E663 Overweight: Secondary | ICD-10-CM

## 2018-09-04 DIAGNOSIS — R519 Headache, unspecified: Secondary | ICD-10-CM

## 2018-09-04 DIAGNOSIS — F5105 Insomnia due to other mental disorder: Secondary | ICD-10-CM

## 2018-09-04 DIAGNOSIS — F419 Anxiety disorder, unspecified: Secondary | ICD-10-CM

## 2018-09-04 DIAGNOSIS — Z9884 Bariatric surgery status: Secondary | ICD-10-CM | POA: Diagnosis not present

## 2018-09-04 DIAGNOSIS — R51 Headache: Secondary | ICD-10-CM | POA: Diagnosis not present

## 2018-09-04 MED ORDER — BUTALBITAL-APAP-CAFFEINE 50-325-40 MG PO TABS: ORAL_TABLET | ORAL | 5 refills | Status: DC

## 2018-09-04 MED ORDER — ALPRAZOLAM 0.5 MG PO TABS: 0.5000 mg | ORAL_TABLET | Freq: Every evening | ORAL | 3 refills | Status: DC | PRN

## 2018-09-04 NOTE — Progress Notes (Signed)
Virtual Visit via Video Note  I connected with@ on 09/06/18 at 10:30 AM EDT by a video enabled telemedicine application and verified that I am speaking with the correct person using two identifiers.  Location patient: home Location provider:work or home office Persons participating in the virtual visit: patient, provider  I discussed the limitations of evaluation and management by telemedicine and the availability of in person appointments. The patient expressed understanding and agreed to proceed.   HPI:  Here to reestablish care (?) last seen 2016  Underwent bariatric surgery ( AGB and  removal and gastric bypass revision)  Oct 2019 at [facility] .  bmi was 34 at time of surgery.   Sees [facility] for gyn dec 2018 last visit  Mammogram oct 2019 normal     Was prescribed fioricet for treatment of sinus headaches perioperatively and it really helped.    Had a sinus infection a few weeks ago,  Resolved with saline irrigation ,  No antibiotics. Has a "sinus headache" a few times per month,  Cannot take NSAIDS.  fioricet helped,  requesting refill  Pollen bothering her now. Using allegra once daily  And prn sudafed  not rinsing or irrigating . Headaches improved with fioricet prn use.   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 2 are under 62 yrs old,  Two boys 20 and 52 ,  Husband jonathan now working from home due to COViD 19, BUT HE IS working extended hours.   Using alprazolam 0.25 mg at bedtime.  Discussed need for daily therapy but she defers, due to history of  manic episode that occurred during post partum period  with trial of celexa.    Labs due this month for 6 month follow up in May. Afraid to go out to their labs.  Offered lab visit here via car    ROS: See pertinent positives and negatives per HPI.  Past Medical History:  Diagnosis Date  . Allergic rhinitis   . Heart murmur   . History of chicken pox   . Menorrhagia   . Migraines   . Urinary frequency     Past  Surgical History:  Procedure Laterality Date  . ABDOMINOPLASTY N/A July 2015  . ABLATION    . HERNIA REPAIR    . LAMINECTOMY  11/2008   L4-5  . lapband  08/2009   and gastric plication  . TUBAL LIGATION  2007  . UMBILICAL HERNIA REPAIR  01/2007    Family History  Problem Relation Age of Onset  . Hypertension Mother   . Diabetes Mother   . Hypertension Father   . Cancer Father        melonoma  . Heart disease Father        A-fib  . Breast cancer Neg Hx     SOCIAL HX: wife and mother of 4,  Homemaker    Current Outpatient Medications:  .  ALPRAZolam (XANAX) 0.5 MG tablet, Take 1 tablet (0.5 mg total) by mouth at bedtime as needed for sleep., Disp: 30 tablet, Rfl: 3 .  butalbital-acetaminophen-caffeine (FIORICET, ESGIC) 50-325-40 MG tablet, One tablet every 4 to 6 hours as needed for headache, Disp: 30 tablet, Rfl: 5  EXAM:  VITALS per patient if applicable:  GENERAL: alert, oriented, appears well and in no acute distress  HEENT: atraumatic, conjunttiva clear, no obvious abnormalities on inspection of external nose and ears  NECK: normal movements of the head and neck  LUNGS: on inspection  no signs of respiratory distress, breathing rate appears normal, no obvious gross SOB, gasping or wheezing  CV: no obvious cyanosis  MS: moves all visible extremities without noticeable abnormality  PSYCH/NEURO: pleasant and cooperative, no obvious depression or anxiety, speech and thought processing grossly intact  ASSESSMENT AND PLAN:  Discussed the following assessment and plan:  Insomnia secondary to anxiety  Overweight (BMI 25.0-29.9)  S/P bariatric surgery  Sinus headache     I discussed the assessment and treatment plan with the patient. The patient was provided an opportunity to ask questions and all were answered. The patient agreed with the plan and demonstrated an understanding of the instructions.   The patient was advised to call back or seek an in-person  evaluation if the symptoms worsen or if the condition fails to improve as anticipated.  I provided 25 minutes of non-face-to-face time during this encounter.   Sherlene Shams, MD

## 2018-09-06 DIAGNOSIS — R51 Headache: Secondary | ICD-10-CM

## 2018-09-06 DIAGNOSIS — J029 Acute pharyngitis, unspecified: Secondary | ICD-10-CM | POA: Insufficient documentation

## 2018-09-06 NOTE — Assessment & Plan Note (Signed)
She has lost 25 lbs since Oct 2019 , last recorded weight 177  In Feb 2020 at [facility] .  She is s/p  APG removal and gastric bypass revision .  She has follow up with [facility] every 3 months,  BMI is now <30 .  Goal is 150 lbs

## 2018-09-06 NOTE — Assessment & Plan Note (Signed)
She is s/plap band removal and  revision of gastric bypass , done at [facility] Oct 2019. There is no height or weight on file to calculate BMI.

## 2018-09-06 NOTE — Assessment & Plan Note (Signed)
Continues to use Prn alprazolam, used sparingly.  Life and financial stressors cited as cause.  

## 2018-09-06 NOTE — Assessment & Plan Note (Signed)
Aggravated by high pollen count current  Advised to add daily sinus irrigation to regimen,  fioricet prescribed for prn use.  NSAIDs are C/I

## 2019-04-27 ENCOUNTER — Encounter: Payer: Self-pay | Admitting: Internal Medicine

## 2019-04-27 ENCOUNTER — Ambulatory Visit (INDEPENDENT_AMBULATORY_CARE_PROVIDER_SITE_OTHER): Payer: 59 | Admitting: Internal Medicine

## 2019-04-27 ENCOUNTER — Other Ambulatory Visit: Payer: Self-pay

## 2019-04-27 VITALS — Ht 65.0 in | Wt 177.0 lb

## 2019-04-27 DIAGNOSIS — J321 Chronic frontal sinusitis: Secondary | ICD-10-CM | POA: Diagnosis not present

## 2019-04-27 DIAGNOSIS — R519 Headache, unspecified: Secondary | ICD-10-CM | POA: Diagnosis not present

## 2019-04-27 DIAGNOSIS — F419 Anxiety disorder, unspecified: Secondary | ICD-10-CM | POA: Diagnosis not present

## 2019-04-27 DIAGNOSIS — F5105 Insomnia due to other mental disorder: Secondary | ICD-10-CM

## 2019-04-27 DIAGNOSIS — Z8709 Personal history of other diseases of the respiratory system: Secondary | ICD-10-CM | POA: Diagnosis not present

## 2019-04-27 DIAGNOSIS — J329 Chronic sinusitis, unspecified: Secondary | ICD-10-CM | POA: Insufficient documentation

## 2019-04-27 MED ORDER — PREDNISONE 10 MG PO TABS: ORAL_TABLET | ORAL | 0 refills | Status: DC

## 2019-04-27 MED ORDER — LEVOFLOXACIN 500 MG PO TABS: 500.0000 mg | ORAL_TABLET | Freq: Every day | ORAL | 0 refills | Status: DC

## 2019-04-27 MED ORDER — AZELASTINE HCL 137 MCG/SPRAY NA SOLN: 1.0000 | Freq: Two times a day (BID) | NASAL | 5 refills | Status: DC

## 2019-04-27 NOTE — Progress Notes (Signed)
Virtual Visit via [email or web address]  This visit type was conducted due to national recommendations for restrictions regarding the COVID-19 pandemic (e.g. social distancing).  This format is felt to be most appropriate for this patient at this time.  All issues noted in this document were discussed and addressed.  No physical exam was performed (except for noted visual exam findings with Video Visits).   I connected with@ on 04/27/19 at  8:30 AM EST by a video enabled telemedicine application and verified that I am speaking with the correct person using two identifiers. Location patient: home Location provider: home office Persons participating in the virtual visit: patient, provider  I discussed the limitations, risks, security and privacy concerns of performing an evaluation and management service by telephone and the availability of in person appointments. I also discussed with the patient that there may be a patient responsible charge related to this service. The patient expressed understanding and agreed to proceed.  Reason for visit: sinusitis  HPI:   51 yr olf female with history of chronic recurrent sinusitis  Managed with nocturnal head elevation,  Topical rinses,  Steroid nasal sprays presents after recent aggravation of symptoms caused [place] the [place] and alteration in bedtime routine.  Pain is currently improved and rated as a 1/10 .  She has had prior MRI in 2010 for chronic daily headaches and was referred to ENT Dr. Harrington Challenger at [facility] who treated her with a 30 day course of abx MRI noted opacification of sinuses on the left  Not offered surgery for unclear reasons.  Since than has had 1-2 episodes per year requiring antibiotic treatment .  Her symptoms were moderate to severe on Friday,  But since she has returned home and to her usual nocturnal posture for sleep (elevated head of bed) symptos have resolved.  No fevers,  Cough  Or sinus drainage.   ROS: See pertinent positives and  negatives per HPI.  Past Medical History:  Diagnosis Date  . Allergic rhinitis   . Heart murmur   . History of chicken pox   . Menorrhagia   . Migraines   . Urinary frequency     Past Surgical History:  Procedure Laterality Date  . ABDOMINOPLASTY N/A July 2015  . ABLATION    . HERNIA REPAIR    . LAMINECTOMY  11/2008   L4-5  . lapband  08/4740   and gastric plication  . TUBAL LIGATION  2007  . UMBILICAL HERNIA REPAIR  01/2007    Family History  Problem Relation Age of Onset  . Hypertension Mother   . Diabetes Mother   . Hypertension Father   . Cancer Father        melonoma  . Heart disease Father        A-fib  . Breast cancer Neg Hx     SOCIAL HX:  reports that she has never smoked. She has never used smokeless tobacco. She reports current alcohol use. She reports that she does not use drugs.   Current Outpatient Medications:  .  ALPRAZolam (XANAX) 0.5 MG tablet, Take 1 tablet (0.5 mg total) by mouth at bedtime as needed for sleep., Disp: 30 tablet, Rfl: 3 .  butalbital-acetaminophen-caffeine (FIORICET, ESGIC) 50-325-40 MG tablet, One tablet every 4 to 6 hours as needed for headache, Disp: 30 tablet, Rfl: 5 .  Azelastine HCl 137 MCG/SPRAY SOLN, Place 1 Squirt into the nose 2 (two) times daily., Disp: 30 mL, Rfl: 5 .  levofloxacin (LEVAQUIN) 500 MG  tablet, Take 1 tablet (500 mg total) by mouth daily., Disp: 14 tablet, Rfl: 0 .  predniSONE (DELTASONE) 10 MG tablet, 6 tablets daily for 3 days, then reduce by 1 tablet daily until gone, Disp: 33 tablet, Rfl: 0  EXAM:  VITALS per patient if applicable:  GENERAL: alert, oriented, appears well and in no acute distress  HEENT: atraumatic, conjunttiva clear, no obvious abnormalities on inspection of external nose and ears  NECK: normal movements of the head and neck  LUNGS: on inspection no signs of respiratory distress, breathing rate appears normal, no obvious gross SOB, gasping or wheezing  CV: no obvious  cyanosis  MS: moves all visible extremities without noticeable abnormality  PSYCH/NEURO: pleasant and cooperative, no obvious depression or anxiety, speech and thought processing grossly intact  ASSESSMENT AND PLAN:  Discussed the following assessment and plan:  Chronic frontal sinusitis - Plan: Ambulatory referral to ENT  Sinus headache  H/O chronic sinusitis  Sinus headache Managed with sudafed and fioricet  Chronic sinusitis Managed with aggressive daily regimen.  symptoms have improved since Friday so there is no indication currently for antibiotics,  However I have given her an rx for levaquin x 2 weeks and prednisone 8 day taper  for future use given the holiday approaching .  ENT evaluation needed as there may be a role for sinus surgery     I discussed the assessment and treatment plan with the patient. The patient was provided an opportunity to ask questions and all were answered. The patient agreed with the plan and demonstrated an understanding of the instructions.   The patient was advised to call back or seek an in-person evaluation if the symptoms worsen or if the condition fails to improve as anticipated.  I provided  25 minutes of non-face-to-face time during this encounter reviewing patient's current problems, previous imaging studies and treatments,  Providing counseling on the above mentioned problems , and coordination  of care .  Sherlene Shams, MD

## 2019-04-27 NOTE — Assessment & Plan Note (Signed)
Continues to use Prn alprazolam, used sparingly.  Life and financial stressors cited as cause.

## 2019-04-27 NOTE — Assessment & Plan Note (Signed)
Managed with sudafed and fioricet

## 2019-04-27 NOTE — Assessment & Plan Note (Addendum)
Managed with aggressive daily regimen.  symptoms have improved since Friday so there is no indication currently for antibiotics,  However I have given her an rx for levaquin x 2 weeks and prednisone 8 day taper  for future use given the holiday approaching .  ENT evaluation needed as there may be a role for sinus surgery

## 2019-05-11 ENCOUNTER — Telehealth: Payer: Self-pay

## 2019-05-11 MED ORDER — ALPRAZOLAM 0.5 MG PO TABS: 0.5000 mg | ORAL_TABLET | Freq: Every evening | ORAL | 5 refills | Status: DC | PRN

## 2019-05-11 NOTE — Telephone Encounter (Signed)
Refilled: 09/04/2018 Last OV: 04/27/2019 Next OV: not scheduled

## 2019-05-11 NOTE — Telephone Encounter (Signed)
What med?

## 2019-05-11 NOTE — Telephone Encounter (Signed)
Sorry is was the alprazolam.

## 2019-05-11 NOTE — Addendum Note (Signed)
Addended by: Crecencio Mc on: 05/11/2019 05:54 PM   Modules accepted: Orders

## 2019-05-19 ENCOUNTER — Telehealth: Payer: Self-pay | Admitting: Internal Medicine

## 2019-05-19 NOTE — Telephone Encounter (Signed)
Pt called back and was given the number to [facility] ent.

## 2019-06-30 ENCOUNTER — Encounter: Payer: Self-pay | Admitting: Obstetrics & Gynecology

## 2019-06-30 ENCOUNTER — Other Ambulatory Visit: Payer: Self-pay

## 2019-06-30 ENCOUNTER — Ambulatory Visit (INDEPENDENT_AMBULATORY_CARE_PROVIDER_SITE_OTHER): Payer: 59 | Admitting: Obstetrics & Gynecology

## 2019-06-30 ENCOUNTER — Other Ambulatory Visit (HOSPITAL_COMMUNITY)
Admission: RE | Admit: 2019-06-30 | Discharge: 2019-06-30 | Disposition: A | Discharge status: Home / Self Care | Payer: 59 | Source: Ambulatory Visit | Attending: Obstetrics & Gynecology | Admitting: Obstetrics & Gynecology

## 2019-06-30 VITALS — BP 100/60 | Ht 65.0 in | Wt 185.0 lb

## 2019-06-30 DIAGNOSIS — N6310 Unspecified lump in the right breast, unspecified quadrant: Secondary | ICD-10-CM

## 2019-06-30 DIAGNOSIS — E559 Vitamin D deficiency, unspecified: Secondary | ICD-10-CM

## 2019-06-30 DIAGNOSIS — Z1329 Encounter for screening for other suspected endocrine disorder: Secondary | ICD-10-CM

## 2019-06-30 DIAGNOSIS — Z1231 Encounter for screening mammogram for malignant neoplasm of breast: Secondary | ICD-10-CM

## 2019-06-30 DIAGNOSIS — Z124 Encounter for screening for malignant neoplasm of cervix: Secondary | ICD-10-CM | POA: Insufficient documentation

## 2019-06-30 DIAGNOSIS — Z01419 Encounter for gynecological examination (general) (routine) without abnormal findings: Secondary | ICD-10-CM | POA: Diagnosis not present

## 2019-06-30 DIAGNOSIS — Z131 Encounter for screening for diabetes mellitus: Secondary | ICD-10-CM

## 2019-06-30 DIAGNOSIS — Z13220 Encounter for screening for lipoid disorders: Secondary | ICD-10-CM

## 2019-06-30 NOTE — Progress Notes (Signed)
HPI:      [name] is a 44 y.o. [id number] who LMP was Patient's last menstrual period was 06/10/2019., she presents today for her annual examination. The patient has no complaints today. The patient is sexually active. Her last pap: approximate date 2018 and was normal and last mammogram: approximate date 2019 and was normal. The patient does perform self breast exams.  There is no notable family history of breast or ovarian cancer in her family.  The patient has regular exercise: yes.  The patient denies current symptoms of depression.    Pt reports 3 week h/o palpable right sided at 7 o'clock position soft mass in breast; NT, no skin changes  Mild GSI Periods reg, 3-4 days, a nuisance but not heavy or painful, +PMS    Prior ablation  GYN History: Contraception: tubal ligation  PMHx: Past Medical History:  Diagnosis Date  . Allergic rhinitis   . Heart murmur   . History of chicken pox   . Menorrhagia   . Migraines   . Urinary frequency    Past Surgical History:  Procedure Laterality Date  . ABDOMINOPLASTY N/A July 2015  . ABLATION    . GASTRIC BYPASS    . HERNIA REPAIR    . LAMINECTOMY  11/2008   L4-5  . lapband  08/2009   and gastric plication  . TUBAL LIGATION  2007  . UMBILICAL HERNIA REPAIR  01/2007   Family History  Problem Relation Age of Onset  . Hypertension Mother   . Diabetes Mother   . Hypertension Father   . Cancer Father        melonoma  . Heart disease Father        A-fib  . Breast cancer Neg Hx    Social History   Tobacco Use  . Smoking status: Never Smoker  . Smokeless tobacco: Never Used  Substance Use Topics  . Alcohol use: Yes    Comment: rarely  . Drug use: No    Current Outpatient Medications:  .  ALPRAZolam (XANAX) 0.5 MG tablet, Take 1 tablet (0.5 mg total) by mouth at bedtime as needed for sleep., Disp: 30 tablet, Rfl: 5 .  butalbital-acetaminophen-caffeine (FIORICET, ESGIC) 50-325-40 MG tablet, One tablet every 4 to 6  hours as needed for headache, Disp: 30 tablet, Rfl: 5 .  Azelastine HCl 137 MCG/SPRAY SOLN, Place 1 Squirt into the nose 2 (two) times daily., Disp: 30 mL, Rfl: 5 .  doxycycline (VIBRAMYCIN) 100 MG capsule, Take by mouth., Disp: , Rfl:  .  levofloxacin (LEVAQUIN) 500 MG tablet, Take 1 tablet (500 mg total) by mouth daily., Disp: 14 tablet, Rfl: 0 .  predniSONE (DELTASONE) 10 MG tablet, 6 tablets daily for 3 days, then reduce by 1 tablet daily until gone, Disp: 33 tablet, Rfl: 0 Allergies: Hydrocodone-acetaminophen, Other, Penicillins, Sulfonamide derivatives, and Vancomycin  Review of Systems  Constitutional: Negative for chills, fever and malaise/fatigue.  HENT: Negative for congestion, sinus pain and sore throat.   Eyes: Negative for blurred vision and pain.  Respiratory: Negative for cough and wheezing.   Cardiovascular: Negative for chest pain and leg swelling.  Gastrointestinal: Negative for abdominal pain, constipation, diarrhea, heartburn, nausea and vomiting.  Genitourinary: Negative for dysuria, frequency, hematuria and urgency.  Musculoskeletal: Negative for back pain, joint pain, myalgias and neck pain.  Skin: Negative for itching and rash.  Neurological: Negative for dizziness, tremors and weakness.  Endo/Heme/Allergies: Does not bruise/bleed easily.  Psychiatric/Behavioral: Negative for depression.  The patient is not nervous/anxious and does not have insomnia.     Objective: BP 100/60   Ht 5\' 5"  (1.651 m)   Wt 185 lb (83.9 kg)   LMP 06/10/2019   BMI 30.79 kg/m   Filed Weights   06/30/19 1420  Weight: 185 lb (83.9 kg)   Body mass index is 30.79 kg/m. Physical Exam Constitutional:      General: She is not in acute distress.    Appearance: She is well-developed.  Genitourinary:     Pelvic exam was performed with patient supine.     Vagina, uterus and rectum normal.     No lesions in the vagina.     No vaginal bleeding.     No cervical motion tenderness,  friability, lesion or polyp.     Uterus is mobile.     Uterus is not enlarged.     No uterine mass detected.    Uterus is midaxial.     No right or left adnexal mass present.     Right adnexa not tender.     Left adnexa not tender.  HENT:     Head: Normocephalic and atraumatic. No laceration.     Right Ear: Hearing normal.     Left Ear: Hearing normal.     Mouth/Throat:     Pharynx: Uvula midline.  Eyes:     Pupils: Pupils are equal, round, and reactive to light.  Neck:     Thyroid: No thyromegaly.  Cardiovascular:     Rate and Rhythm: Normal rate and regular rhythm.     Heart sounds: No murmur. No friction rub. No gallop.   Pulmonary:     Effort: Pulmonary effort is normal. No respiratory distress.     Breath sounds: Normal breath sounds. No wheezing.  Chest:     Breasts:        Right: Mass present. No skin change or tenderness.        Left: No mass, skin change or tenderness.     Comments: Small soft/fatty 2cm R OLQ breast mass Abdominal:     General: Bowel sounds are normal. There is no distension.     Palpations: Abdomen is soft.     Tenderness: There is no abdominal tenderness. There is no rebound.  Musculoskeletal:        General: Normal range of motion.     Cervical back: Normal range of motion and neck supple.  Neurological:     Mental Status: She is alert and oriented to person, place, and time.     Cranial Nerves: No cranial nerve deficit.  Skin:    General: Skin is warm and dry.  Psychiatric:        Judgment: Judgment normal.  Vitals reviewed.     Assessment:  ANNUAL EXAM 1. Women's annual routine gynecological examination   2. Screening for cervical cancer   3. Encounter for screening mammogram for malignant neoplasm of breast   4. Breast mass, right   5. Screening for cholesterol level   6. Screening for diabetes mellitus   7. Screening for thyroid disorder   8. Vitamin D deficiency      Screening Plan:            1.  Cervical Screening-  Pap  smear done today  2. Breast screening as well as diagnostic imaging (for roght sided fatty small breast mass) - Exam annually and mammogram planned   3. Labs To return fasting at a later date  4. No need for counseling for contraception: bilateral tubal ligation    F/U  Return in about 1 year (around 06/29/2020) for Annual.  Annamarie Major, MD, [facility], [facility] 06/30/2019  2:51 PM

## 2019-06-30 NOTE — Patient Instructions (Signed)
PAP every three years Mammogram every year    Call [phone] to schedule at [facility] if have not heard from them soon Labs soon here

## 2019-07-01 ENCOUNTER — Other Ambulatory Visit: Payer: Self-pay | Admitting: Obstetrics & Gynecology

## 2019-07-01 ENCOUNTER — Other Ambulatory Visit: Payer: Self-pay

## 2019-07-01 ENCOUNTER — Other Ambulatory Visit: Payer: 59

## 2019-07-01 ENCOUNTER — Telehealth: Payer: Self-pay | Admitting: Obstetrics & Gynecology

## 2019-07-01 DIAGNOSIS — N6310 Unspecified lump in the right breast, unspecified quadrant: Secondary | ICD-10-CM

## 2019-07-01 DIAGNOSIS — E559 Vitamin D deficiency, unspecified: Secondary | ICD-10-CM

## 2019-07-01 DIAGNOSIS — Z1231 Encounter for screening mammogram for malignant neoplasm of breast: Secondary | ICD-10-CM

## 2019-07-01 DIAGNOSIS — Z1329 Encounter for screening for other suspected endocrine disorder: Secondary | ICD-10-CM

## 2019-07-01 DIAGNOSIS — Z13220 Encounter for screening for lipoid disorders: Secondary | ICD-10-CM

## 2019-07-01 DIAGNOSIS — Z131 Encounter for screening for diabetes mellitus: Secondary | ICD-10-CM

## 2019-07-01 NOTE — Telephone Encounter (Signed)
Patient aware of time and date of her appt at [facility] on 07/03/19 @ 2:20pm

## 2019-07-01 NOTE — Telephone Encounter (Signed)
Left message for pt to call office back in regards to her mammo appt time and date.

## 2019-07-02 LAB — LIPID PANEL
Chol/HDL Ratio: 3.3 ratio (ref 0.0–4.4)
Cholesterol, Total: 147 mg/dL (ref 100–199)
HDL: 45 mg/dL (ref >39)
LDL Chol Calc (NIH): 84 mg/dL (ref 0–99)
Triglycerides: 99 mg/dL (ref 0–149)
VLDL Cholesterol Cal: 18 mg/dL (ref 5–40)

## 2019-07-02 LAB — GLUCOSE, FASTING: Glucose, Plasma: 86 mg/dL (ref 65–99)

## 2019-07-02 LAB — CYTOLOGY - PAP
Comment: NEGATIVE
Diagnosis: NEGATIVE
High risk HPV: NEGATIVE

## 2019-07-02 LAB — TSH: TSH: 2.42 u[IU]/mL (ref 0.450–4.500)

## 2019-07-03 ENCOUNTER — Other Ambulatory Visit: Payer: 59

## 2019-07-10 ENCOUNTER — Ambulatory Visit
Admission: RE | Admit: 2019-07-10 | Discharge: 2019-07-10 | Disposition: A | Discharge status: Home / Self Care | Payer: 59 | Source: Ambulatory Visit | Attending: Obstetrics & Gynecology | Admitting: Obstetrics & Gynecology

## 2019-07-10 DIAGNOSIS — Z1231 Encounter for screening mammogram for malignant neoplasm of breast: Secondary | ICD-10-CM | POA: Insufficient documentation

## 2019-07-10 DIAGNOSIS — N6310 Unspecified lump in the right breast, unspecified quadrant: Secondary | ICD-10-CM | POA: Diagnosis present

## 2019-12-14 ENCOUNTER — Other Ambulatory Visit: Payer: Self-pay

## 2019-12-14 ENCOUNTER — Ambulatory Visit: Payer: 59 | Admitting: Internal Medicine

## 2019-12-14 ENCOUNTER — Encounter: Payer: Self-pay | Admitting: Internal Medicine

## 2019-12-14 DIAGNOSIS — J320 Chronic maxillary sinusitis: Secondary | ICD-10-CM

## 2019-12-14 DIAGNOSIS — R151 Fecal smearing: Secondary | ICD-10-CM | POA: Insufficient documentation

## 2019-12-14 MED ORDER — LEVOFLOXACIN 500 MG PO TABS: 500.0000 mg | ORAL_TABLET | Freq: Every day | ORAL | 0 refills | Status: DC

## 2019-12-14 MED ORDER — LEVOFLOXACIN 500 MG PO TABS: 500.0000 mg | ORAL_TABLET | Freq: Every day | ORAL | 1 refills | Status: DC

## 2019-12-14 MED ORDER — PREDNISONE 10 MG PO TABS: ORAL_TABLET | ORAL | 1 refills | Status: DC

## 2019-12-14 NOTE — Progress Notes (Signed)
Subjective:  Patient ID: [name], female    DOB: 02-04-1976  Age: 44 y.o. MRN: [id number]  CC: Diagnoses of Chronic maxillary sinusitis and Fecal soiling were pertinent to this visit.  HPI Carlon N Toothaker presents for  Follow up on chronic sinusitis,  Chronic headaches.    This visit occurred during the SARS-CoV-2 public health emergency.  Safety protocols were in place, including screening questions prior to the visit, additional usage of staff PPE, and extensive cleaning of exam room while observing appropriate contact time as indicated for disinfecting solutions.     Last seen in November.  Referred to [facility] ENT for 2nd opinion.  Saw Dario Guardian,  CT done which noted bilateral maxillary sinus mucus retention cysts with mucosal thickening worse on the left.  , Advised to have surgery, but given a 21 day course of antibiotics that resolved her infection and headache for several months.Marland Kitchenazelastine stopped and flonase prescribed.     Had no recurrence until April,  Took 14 days of levaquin with resolution.  Had another episode 2 weeks ago,  Given appointment for today.  Symptoms have improved without antibiotics,  Last headache was Saturday on the right temple and still has symptoms of fluid on the left ear.   She does not want to have surgery .  She was treated for 21 days in January   Infection and headache resolved  Had another episode in April,  Resolved with 14 days of levaquin   Most recent episode was 2 weeks ago with sinusitis and fluid behind the right ear.   2) incomplete evacuation of bowels for the past 5 years.  Fecal soiling /  docussed GI referral no hemorrhoids.  Outpatient Medications Prior to Visit  Medication Sig Dispense Refill  . ALPRAZolam (XANAX) 0.5 MG tablet Take 1 tablet (0.5 mg total) by mouth at bedtime as needed for sleep. 30 tablet 5  . butalbital-acetaminophen-caffeine (FIORICET, ESGIC) 50-325-40 MG tablet One tablet every 4 to 6 hours as needed for  headache 30 tablet 5  . fluticasone (FLONASE) 50 MCG/ACT nasal spray Place 1 spray into both nostrils daily.     No facility-administered medications prior to visit.    Review of Systems;  Patient denies headache, fevers, malaise, unintentional weight loss, skin rash, eye pain, sinus congestion and sinus pain, sore throat, dysphagia,  hemoptysis , cough, dyspnea, wheezing, chest pain, palpitations, orthopnea, edema, abdominal pain, nausea, melena, diarrhea, constipation, flank pain, dysuria, hematuria, urinary  Frequency, nocturia, numbness, tingling, seizures,  Focal weakness, Loss of consciousness,  Tremor, insomnia, depression, anxiety, and suicidal ideation.      Objective:  BP 104/78 (BP Location: Left Arm, Patient Position: Sitting, Cuff Size: Normal)   Pulse 85   Temp 97.9 F (36.6 C) (Oral)   Resp 15   Ht 5\' 5"  (1.651 m)   Wt 176 lb 9.6 oz (80.1 kg)   SpO2 98%   BMI 29.39 kg/m   BP Readings from Last 3 Encounters:  12/14/19 104/78  06/30/19 100/60  05/06/17 120/80    Wt Readings from Last 3 Encounters:  12/14/19 176 lb 9.6 oz (80.1 kg)  06/30/19 185 lb (83.9 kg)  04/27/19 177 lb (80.3 kg)    General appearance: alert, cooperative and appears stated age Ears: normal TM's and external ear canals both ears Throat: lips, mucosa, and tongue normal; teeth and gums normal Neck: no adenopathy, no carotid bruit, supple, symmetrical, trachea midline and thyroid not enlarged, symmetric, no tenderness/mass/nodules Back: symmetric,  no curvature. ROM normal. No CVA tenderness. Lungs: clear to auscultation bilaterally Heart: regular rate and rhythm, S1, S2 normal, no murmur, click, rub or gallop Abdomen: soft, non-tender; bowel sounds normal; no masses,  no organomegaly Pulses: 2+ and symmetric Skin: Skin color, texture, turgor normal. No rashes or lesions Lymph nodes: Cervical, supraclavicular, and axillary nodes normal.  No results found for: HGBA1C  Lab Results    Component Value Date   CREATININE 0.52 09/30/2014   CREATININE 0.7 08/04/2012    Lab Results  Component Value Date   WBC 5.0 09/30/2014   HGB 13.3 09/30/2014   HCT 39.2 09/30/2014   PLT 264.0 09/30/2014   GLUCOSE 90 09/30/2014   CHOL 147 07/01/2019   TRIG 99 07/01/2019   HDL 45 07/01/2019   LDLCALC 84 07/01/2019   ALT 11 09/30/2014   AST 12 09/30/2014   NA 137 09/30/2014   K 4.3 09/30/2014   CL 105 09/30/2014   CREATININE 0.52 09/30/2014   BUN 18 09/30/2014   CO2 29 09/30/2014   TSH 2.420 07/01/2019    US BREAST LTD UNI RIGHT INC AXILLA  Result Date: 08/10/2019 CLINICAL DATA:  Mass felt by the patient in the 7 o'clock position of the right breast for the past month. EXAM: DIGITAL DIAGNOSTIC BILATERAL MAMMOGRAM WITH CAD AND TOMO ULTRASOUND LEFT BREAST COMPARISON:  Previous exam(s). ACR Breast Density Category b: There are scattered areas of fibroglandular density. FINDINGS: Stable mammographic appearance of the breasts with no findings suspicious for malignancy in either breast. No mass or other abnormality is seen at the location of patient concern on the right, marked with a metallic marker. Mammographic images were processed with CAD. On physical exam, there is an approximately 2 x 1 cm oval, circumscribed, mobile palpable mass in the 7:30 o'clock position of the right breast, 8 cm from the, corresponding to the mass felt by the patient. This is mildly compressible. Targeted ultrasound is performed, showing a 2.3 x 1.9 x 0.7 cm oval, horizontally oriented, circumscribed mass in the 7:30 o'clock position of the right breast, 8 cm from the nipple. This is mildly echogenic relative to adjacent fat lobules and mildly heterogeneous. IMPRESSION: 1. 2.3 cm lipoma corresponding to the palpable mass in the 7:30 o'clock position of the right breast. 2. No evidence of malignancy in either breast. RECOMMENDATION: Bilateral screening mammogram in 1 year. I have discussed the findings and  recommendations with the patient. If applicable, a reminder letter will be sent to the patient regarding the next appointment. BI-RADS CATEGORY  2: Benign. Electronically Signed   By: Beckie Salts M.D.   On: 07/10/2019 13:47   MM DIAG BREAST TOMO BILATERAL  Result Date: 07/10/2019 CLINICAL DATA:  Mass felt by the patient in the 7 o'clock position of the right breast for the past month. EXAM: DIGITAL DIAGNOSTIC BILATERAL MAMMOGRAM WITH CAD AND TOMO ULTRASOUND LEFT BREAST COMPARISON:  Previous exam(s). ACR Breast Density Category b: There are scattered areas of fibroglandular density. FINDINGS: Stable mammographic appearance of the breasts with no findings suspicious for malignancy in either breast. No mass or other abnormality is seen at the location of patient concern on the right, marked with a metallic marker. Mammographic images were processed with CAD. On physical exam, there is an approximately 2 x 1 cm oval, circumscribed, mobile palpable mass in the 7:30 o'clock position of the right breast, 8 cm from the, corresponding to the mass felt by the patient. This is mildly compressible. Targeted ultrasound is performed,  showing a 2.3 x 1.9 x 0.7 cm oval, horizontally oriented, circumscribed mass in the 7:30 o'clock position of the right breast, 8 cm from the nipple. This is mildly echogenic relative to adjacent fat lobules and mildly heterogeneous. IMPRESSION: 1. 2.3 cm lipoma corresponding to the palpable mass in the 7:30 o'clock position of the right breast. 2. No evidence of malignancy in either breast. RECOMMENDATION: Bilateral screening mammogram in 1 year. I have discussed the findings and recommendations with the patient. If applicable, a reminder letter will be sent to the patient regarding the next appointment. BI-RADS CATEGORY  2: Benign. Electronically Signed   By: Beckie Salts M.D.   On: 07/10/2019 13:47    Assessment & Plan:   Problem List Items Addressed This Visit      Unprioritized    Chronic sinusitis    She was treated with doxycycline 100 mg bid x 21 days in early February by ENT along with buffered saline lavaged bid.  Symptoms resolved completely until late April.  Self treated with 14 days of levaquin to resolution.  Symptoms recurred in late June and have improved without use of antibiotics but now has otitis media in left ear.  Levaquin for 7 days.  Resume buffered saline nasal lavages       Relevant Medications   fluticasone (FLONASE) 50 MCG/ACT nasal spray   predniSONE (DELTASONE) 10 MG tablet   levofloxacin (LEVAQUIN) 500 MG tablet   Fecal soiling    Chronic x 5 years,  Without external hemorrhoids,  Constipation.  GI referral       Relevant Orders   Ambulatory referral to Gastroenterology      I am having Aspin N. Bangert start on predniSONE. I am also having her maintain her butalbital-acetaminophen-caffeine, ALPRAZolam, fluticasone, and levofloxacin.  Meds ordered this encounter  Medications  . predniSONE (DELTASONE) 10 MG tablet    Sig: 2 tablets daily for 5 days    Dispense:  10 tablet    Refill:  1  . DISCONTD: levofloxacin (LEVAQUIN) 500 MG tablet    Sig: Take 1 tablet (500 mg total) by mouth daily.    Dispense:  14 tablet    Refill:  0  . levofloxacin (LEVAQUIN) 500 MG tablet    Sig: Take 1 tablet (500 mg total) by mouth daily.    Dispense:  14 tablet    Refill:  1    Medications Discontinued During This Encounter  Medication Reason  . levofloxacin (LEVAQUIN) 500 MG tablet     Follow-up: No follow-ups on file.   Sherlene Shams, MD

## 2019-12-14 NOTE — Assessment & Plan Note (Signed)
She was treated with doxycycline 100 mg bid x 21 days in early February by ENT along with buffered saline lavaged bid.  Symptoms resolved completely until late April.  Self treated with 14 days of levaquin to resolution.  Symptoms recurred in late June and have improved without use of antibiotics but now has otitis media in left ear.  Levaquin for 7 days.  Resume buffered saline nasal lavages

## 2019-12-14 NOTE — Assessment & Plan Note (Signed)
Chronic x 5 years,  Without external hemorrhoids,  Constipation.  GI referral

## 2019-12-14 NOTE — Patient Instructions (Addendum)
Tell your mom about betahistine hcl  24 mg  for prevention of vertigo .   It can be obtained by prescription from a compounding pharmacy   Referral to [facility]   For the otitis media on the left side:  Levaquin 500 mg for 7 days,  Prednisone 20 mg daily x 5 days,   Continue sudafed  And saline lavage

## 2019-12-23 ENCOUNTER — Encounter: Payer: Self-pay | Admitting: *Deleted

## 2020-03-03 ENCOUNTER — Other Ambulatory Visit: Payer: Self-pay | Admitting: Internal Medicine

## 2020-03-11 ENCOUNTER — Ambulatory Visit: Payer: 59 | Admitting: Internal Medicine

## 2020-03-15 ENCOUNTER — Other Ambulatory Visit: Payer: Self-pay | Admitting: Internal Medicine

## 2020-06-30 ENCOUNTER — Other Ambulatory Visit: Payer: Self-pay

## 2020-06-30 ENCOUNTER — Encounter: Payer: Self-pay | Admitting: Obstetrics & Gynecology

## 2020-06-30 ENCOUNTER — Ambulatory Visit (INDEPENDENT_AMBULATORY_CARE_PROVIDER_SITE_OTHER): Payer: BC Managed Care – PPO | Admitting: Obstetrics & Gynecology

## 2020-06-30 VITALS — BP 120/80 | Ht 65.0 in | Wt 184.0 lb

## 2020-06-30 DIAGNOSIS — Z1211 Encounter for screening for malignant neoplasm of colon: Secondary | ICD-10-CM | POA: Diagnosis not present

## 2020-06-30 DIAGNOSIS — Z1231 Encounter for screening mammogram for malignant neoplasm of breast: Secondary | ICD-10-CM

## 2020-06-30 DIAGNOSIS — Z01419 Encounter for gynecological examination (general) (routine) without abnormal findings: Secondary | ICD-10-CM

## 2020-06-30 DIAGNOSIS — R159 Full incontinence of feces: Secondary | ICD-10-CM

## 2020-06-30 NOTE — Patient Instructions (Signed)
PAP every three years Mammogram every year    Call [phone] to schedule at [facility] next year  Thank you for choosing [facility]. As part of our ongoing efforts to improve patient experience, we would appreciate your feedback. Please fill out the short survey that you will receive by mail or MyChart. Your opinion is important to [place]! - Dr. Tiburcio Pea  Recommendations to boost your immunity to prevent illness such as viral flu and colds, including covid19, are as follows:       - - -  Vitamin K2 and Vitamin D3  - - - Take Vitamin K2 at 200-300 mcg daily (usually 2-3 pills daily of the over the counter formulation). Take Vitamin D3 at 3000-4000 U daily (usually 3-4 pills daily of the over the counter formulation). Studies show that these two at high normal levels in your system are very effective in keeping your immunity so strong and protective that you will be unlikely to contract viral illness such as those listed above.  Dr Tiburcio Pea

## 2020-06-30 NOTE — Progress Notes (Signed)
HPI:      [name] is a 45 y.o. [id number] who LMP was No LMP recorded., she presents today for her annual examination. The patient has no complaints today. She has voiced concern over have weak rectal sphincter and worrisome for hygiene and possibility for leakage or getting on clothes. The patient is sexually active. Her last pap: approximate date 2021 and was normal and last mammogram: approximate date 2021 and was normal. The patient does perform self breast exams.  There is no notable family history of breast or ovarian cancer in her family.  The patient has regular exercise: yes.  The patient denies current symptoms of depression.    GYN History: Contraception: tubal ligation  PMHx: Past Medical History:  Diagnosis Date  . Allergic rhinitis   . Heart murmur   . History of chicken pox   . Menorrhagia   . Migraines   . Urinary frequency    Past Surgical History:  Procedure Laterality Date  . ABDOMINOPLASTY N/A July 2015  . ABLATION    . GASTRIC BYPASS    . HERNIA REPAIR    . LAMINECTOMY  11/2008   L4-5  . lapband  08/2009   and gastric plication  . TUBAL LIGATION  2007  . UMBILICAL HERNIA REPAIR  01/2007   Family History  Problem Relation Age of Onset  . Hypertension Mother   . Diabetes Mother   . Hypertension Father   . Cancer Father        melonoma  . Heart disease Father        A-fib  . Lung cancer Father 23  . Melanoma Father   . Breast cancer Neg Hx    Social History   Tobacco Use  . Smoking status: Never Smoker  . Smokeless tobacco: Never Used  Vaping Use  . Vaping Use: Never used  Substance Use Topics  . Alcohol use: Yes    Comment: rarely  . Drug use: No    Current Outpatient Medications:  .  ALPRAZolam (XANAX) 0.5 MG tablet, Take 1 tablet (0.5 mg total) by mouth at bedtime as needed for sleep. (Patient not taking: Reported on 06/30/2020), Disp: 30 tablet, Rfl: 5 .  butalbital-acetaminophen-caffeine (FIORICET, ESGIC) 50-325-40 MG tablet,  One tablet every 4 to 6 hours as needed for headache (Patient not taking: Reported on 06/30/2020), Disp: 30 tablet, Rfl: 5 .  fluticasone (FLONASE) 50 MCG/ACT nasal spray, Place 1 spray into both nostrils daily. (Patient not taking: Reported on 06/30/2020), Disp: , Rfl:  .  levofloxacin (LEVAQUIN) 500 MG tablet, Take 1 tablet (500 mg total) by mouth daily. (Patient not taking: Reported on 06/30/2020), Disp: 14 tablet, Rfl: 1 .  predniSONE (DELTASONE) 10 MG tablet, TAKE 2 TABLETS BY MOUTH DAILY FOR 5 DAYS (Patient not taking: Reported on 06/30/2020), Disp: 10 tablet, Rfl: 1 Allergies: Hydrocodone-acetaminophen, Other, Penicillins, Sulfonamide derivatives, and Vancomycin  Review of Systems  Constitutional: Negative for chills, fever and malaise/fatigue.  HENT: Negative for congestion, sinus pain and sore throat.   Eyes: Negative for blurred vision and pain.  Respiratory: Negative for cough and wheezing.   Cardiovascular: Negative for chest pain and leg swelling.  Gastrointestinal: Negative for abdominal pain, constipation, diarrhea, heartburn, nausea and vomiting.  Genitourinary: Negative for dysuria, frequency, hematuria and urgency.  Musculoskeletal: Negative for back pain, joint pain, myalgias and neck pain.  Skin: Negative for itching and rash.  Neurological: Negative for dizziness, tremors and weakness.  Endo/Heme/Allergies: Does not bruise/bleed easily.  Psychiatric/Behavioral: Negative for depression. The patient is not nervous/anxious and does not have insomnia.     Objective: BP 120/80   Ht 5\' 5"  (1.651 m)   Wt 184 lb (83.5 kg)   BMI 30.62 kg/m   Filed Weights   06/30/20 1050  Weight: 184 lb (83.5 kg)   Body mass index is 30.62 kg/m. Physical Exam Constitutional:      General: She is not in acute distress.    Appearance: She is well-developed and well-nourished.  Genitourinary:     Vagina, uterus and rectum normal.     There is no rash or lesion on the right labia.     There  is no rash or lesion on the left labia.    No lesions in the vagina.     No vaginal bleeding.      Right Adnexa: not tender and no mass present.    Left Adnexa: not tender and no mass present.    No cervical motion tenderness, friability, lesion or polyp.     Uterus is mobile.     Uterus is not enlarged.     No uterine mass detected.    Uterus is midaxial.     Pelvic exam was performed with patient supine.  Breasts:     Right: No mass, skin change or tenderness.     Left: No mass, skin change or tenderness.    HENT:     Head: Normocephalic and atraumatic. No laceration.     Right Ear: Hearing normal.     Left Ear: Hearing normal.     Nose: No epistaxis or foreign body.     Mouth/Throat:     Mouth: Oropharynx is clear and moist and mucous membranes are normal.     Pharynx: Uvula midline.  Eyes:     Pupils: Pupils are equal, round, and reactive to light.  Neck:     Thyroid: No thyromegaly.  Cardiovascular:     Rate and Rhythm: Normal rate and regular rhythm.     Heart sounds: No murmur heard. No friction rub. No gallop.   Pulmonary:     Effort: Pulmonary effort is normal. No respiratory distress.     Breath sounds: Normal breath sounds. No wheezing.  Abdominal:     General: Bowel sounds are normal. There is no distension.     Palpations: Abdomen is soft.     Tenderness: There is no abdominal tenderness. There is no rebound.  Musculoskeletal:        General: Normal range of motion.     Cervical back: Normal range of motion and neck supple.  Neurological:     Mental Status: She is alert and oriented to person, place, and time.     Cranial Nerves: No cranial nerve deficit.  Skin:    General: Skin is warm and dry.  Psychiatric:        Mood and Affect: Mood and affect normal.        Judgment: Judgment normal.  Vitals reviewed.     Assessment:  ANNUAL EXAM 1. Women's annual routine gynecological examination   2. Encounter for screening mammogram for malignant neoplasm  of breast   3. Screen for colon cancer   4. Rectal sphincter incontinence      Screening Plan:            1.  Cervical Screening-  Pap smear schedule reviewed with patient  2. Breast screening- Exam annually and mammogram>40 planned , pt prefers every other  year  3. Colonoscopy every 10 years, Hemoccult testing - after age 20  4. Labs UTD   5. Rectal sphincter incontinence - Ambulatory referral to Gastroenterology      F/U  Return in about 1 year (around 06/30/2021) for Annual.  Annamarie Major, MD, [facility], [facility] 06/30/2020  11:29 AM

## 2020-07-27 ENCOUNTER — Encounter: Payer: Self-pay | Admitting: Internal Medicine

## 2020-07-27 ENCOUNTER — Telehealth (INDEPENDENT_AMBULATORY_CARE_PROVIDER_SITE_OTHER): Payer: BC Managed Care – PPO | Admitting: Internal Medicine

## 2020-07-27 DIAGNOSIS — J0100 Acute maxillary sinusitis, unspecified: Secondary | ICD-10-CM

## 2020-07-27 MED ORDER — LEVOFLOXACIN 500 MG PO TABS: 500.0000 mg | ORAL_TABLET | Freq: Every day | ORAL | 0 refills | Status: DC

## 2020-07-27 MED ORDER — CHERATUSSIN AC 100-10 MG/5ML PO SOLN: 5.0000 mL | Freq: Three times a day (TID) | ORAL | 0 refills | Status: DC | PRN

## 2020-07-27 NOTE — Progress Notes (Signed)
Virtual Visit converted to telephone note  This visit type was conducted due to national recommendations for restrictions regarding the COVID-19 pandemic (e.g. social distancing).  This format is felt to be most appropriate for this patient at this time.  All issues noted in this document were discussed and addressed.  No physical exam was performed (except for noted visual exam findings with Video Visits).   I connected with@ on 07/27/20 at  4:30 PM EST initally  by a video enabled telemedicine application  and verified that I am speaking with the correct person using two identifiers. Location patient: home Location provider: work or home office Persons participating in the virtual visit: patient, provider  I discussed the limitations, risks, security and privacy concerns of performing an evaluation and management service by telephone and the availability of in person appointments. I also discussed with the patient that there may be a patient responsible charge related to this service. The patient expressed understanding and agreed to proceed.  Interactive audio and video telecommunications were attempted between this provider and patient, however failed, due to patient having technical difficulties   We continued and completed visit with audio only.   Reason for visit: sinusitis   HPI:  45 yr old unvaccinated female with history of recurrent sinusitis ,  COVID 19 infection in October .    presents with symptoms of congestion, facial pain and maxillary sinus and upper molar pain that started one week ago.  Started taking  levaquin 4 days ago,  Feels like she is starting to improve today with now productive cough .   Had COVID infection in mid October       ROS: See pertinent positives and negatives per HPI.  Past Medical History:  Diagnosis Date  . Allergic rhinitis   . Heart murmur   . History of chicken pox   . Menorrhagia   . Migraines   . Urinary frequency     Past Surgical  History:  Procedure Laterality Date  . ABDOMINOPLASTY N/A July 2015  . ABLATION    . GASTRIC BYPASS    . HERNIA REPAIR    . LAMINECTOMY  11/2008   L4-5  . lapband  08/2009   and gastric plication  . TUBAL LIGATION  2007  . UMBILICAL HERNIA REPAIR  01/2007    Family History  Problem Relation Age of Onset  . Hypertension Mother   . Diabetes Mother   . Hypertension Father   . Cancer Father        melonoma  . Heart disease Father        A-fib  . Lung cancer Father 47  . Melanoma Father   . Breast cancer Neg Hx     SOCIAL HX:  reports that she has never smoked. She has never used smokeless tobacco. She reports current alcohol use. She reports that she does not use drugs.   Current Outpatient Medications:  .  ALPRAZolam (XANAX) 0.5 MG tablet, Take 1 tablet (0.5 mg total) by mouth at bedtime as needed for sleep., Disp: 30 tablet, Rfl: 5 .  butalbital-acetaminophen-caffeine (FIORICET, ESGIC) 50-325-40 MG tablet, One tablet every 4 to 6 hours as needed for headache, Disp: 30 tablet, Rfl: 5 .  fluticasone (FLONASE) 50 MCG/ACT nasal spray, Place 1 spray into both nostrils daily., Disp: , Rfl:  .  guaiFENesin-codeine (CHERATUSSIN AC) 100-10 MG/5ML syrup, Take 5 mLs by mouth 3 (three) times daily as needed for cough., Disp: 120 mL, Rfl: 0 .  levofloxacin (  LEVAQUIN) 500 MG tablet, Take 1 tablet (500 mg total) by mouth daily., Disp: 14 tablet, Rfl: 1 .  levofloxacin (LEVAQUIN) 500 MG tablet, Take 1 tablet (500 mg total) by mouth daily., Disp: 14 tablet, Rfl: 0 .  predniSONE (DELTASONE) 10 MG tablet, TAKE 2 TABLETS BY MOUTH DAILY FOR 5 DAYS, Disp: 10 tablet, Rfl: 1  EXAM:   General impression: alert, cooperative and articulate.  No signs of being in distress  Lungs: speech is fluent sentence length suggests that patient is not short of breath and not punctuated by cough, sneezing or sniffing. Marland Kitchen   Psych: affect normal.  speech is articulate and non pressured .  Denies suicidal thoughts     ASSESSMENT AND PLAN:  Discussed the following assessment and plan:  Acute maxillary sinusitis, recurrence not specified  Acute maxillary sinusitis May have been COVID19 INFECTION,  But symptoms improving. Adding   Levaquin for 7 days.along with cough suppressant.   Resume buffered saline nasal lavages     I discussed the assessment and treatment plan with the patient. The patient was provided an opportunity to ask questions and all were answered. The patient agreed with the plan and demonstrated an understanding of the instructions.   The patient was advised to call back or seek an in-person evaluation if the symptoms worsen or if the condition fails to improve as anticipated.   I spent 20 minutes dedicated to the care of this patient on the date of this encounter to include pre-visit review of his medical history,  Face-to-face time with the patient , and post visit ordering of testing and therapeutics.    Sherlene Shams, MD

## 2020-07-28 NOTE — Assessment & Plan Note (Addendum)
May have been COVID19 INFECTION,  But symptoms improving. Adding   Levaquin for 7 days.along with cough suppressant.   Resume buffered saline nasal lavages

## 2020-08-02 ENCOUNTER — Other Ambulatory Visit: Payer: Self-pay

## 2020-08-02 NOTE — Telephone Encounter (Signed)
Refilled: 05/11/2019 Last OV: 07/27/2020 for sinusitis Next OV: not scheduled

## 2020-08-03 MED ORDER — ALPRAZOLAM 0.5 MG PO TABS: 0.5000 mg | ORAL_TABLET | Freq: Every evening | ORAL | 5 refills | Status: DC | PRN

## 2020-08-05 ENCOUNTER — Other Ambulatory Visit: Payer: Self-pay

## 2020-08-05 ENCOUNTER — Ambulatory Visit (INDEPENDENT_AMBULATORY_CARE_PROVIDER_SITE_OTHER): Payer: BC Managed Care – PPO | Admitting: Gastroenterology

## 2020-08-05 ENCOUNTER — Encounter: Payer: Self-pay | Admitting: Gastroenterology

## 2020-08-05 VITALS — BP 112/77 | HR 64 | Temp 97.9°F | Ht 65.0 in | Wt 186.5 lb

## 2020-08-05 DIAGNOSIS — R140 Abdominal distension (gaseous): Secondary | ICD-10-CM

## 2020-08-05 DIAGNOSIS — R195 Other fecal abnormalities: Secondary | ICD-10-CM | POA: Diagnosis not present

## 2020-08-05 DIAGNOSIS — Z9884 Bariatric surgery status: Secondary | ICD-10-CM

## 2020-08-05 NOTE — Progress Notes (Signed)
Arlyss Repress, MD [address]  [address]  [place], [place] [address]  Main: [phone]  Fax: [phone]    Gastroenterology Consultation  Referring Provider:     Sherlene Shams, MD Primary Care Physician:  Sherlene Shams, MD Primary Gastroenterologist:  Dr. Arlyss Repress Reason for Consultation:     Loose stools, anal seepage        HPI:   Stephanie Boyd is a 45 y.o. female referred by Dr. Sherlene Shams, MD  for consultation & management of loose stools and anal seepage.  Patient has history of Roux-en-Y gastric bypass in 2019.  She originally had lap band in 2016, converted to Roux-en-Y gastric bypass.  Patient reports that she has been experiencing nonbloody very soft bowel movements, pudding-like and associated with difficulty cleaning due to constant seepage of stool per rectum every time she has a BM.  She denies any incontinence.  Patient has 2 soft bowel movements daily.  She does report intermittent abdominal bloating as well.  She thinks she has fructose intolerance.  She reports having 2 pregnancies with big babies and prolonged labor, underwent episiotomies.  She also thinks that she may not have good anal sphincter tone.  She was doing Kegel exercises intermittently.  Her weight has been stable.  She manages to eat very healthy, balanced diet.  Patient was found to be anemic, hemoglobin 11 in 03/2018.  No follow-up labs since then Patient denies any other GI symptoms otherwise Patient does not smoke or drink alcohol NSAIDs: None  Antiplts/Anticoagulants/Anti thrombotics: None  GI Procedures: None She had umbilical hernia repair  Past Medical History:  Diagnosis Date  . Allergic rhinitis   . Heart murmur   . History of chicken pox   . Menorrhagia   . Migraines   . Urinary frequency     Past Surgical History:  Procedure Laterality Date  . ABDOMINOPLASTY N/A July 2015  . ABLATION    . GASTRIC BYPASS    . HERNIA REPAIR    . LAMINECTOMY  11/2008    L4-5  . lapband  08/2009   and gastric plication  . TUBAL LIGATION  2007  . UMBILICAL HERNIA REPAIR  01/2007    Current Outpatient Medications:  .  ALPRAZolam (XANAX) 0.5 MG tablet, Take 1 tablet (0.5 mg total) by mouth at bedtime as needed for sleep., Disp: 30 tablet, Rfl: 5 .  butalbital-acetaminophen-caffeine (FIORICET, ESGIC) 50-325-40 MG tablet, One tablet every 4 to 6 hours as needed for headache, Disp: 30 tablet, Rfl: 5 .  fluticasone (FLONASE) 50 MCG/ACT nasal spray, Place 1 spray into both nostrils daily., Disp: , Rfl:  .  guaiFENesin-codeine (CHERATUSSIN AC) 100-10 MG/5ML syrup, Take 5 mLs by mouth 3 (three) times daily as needed for cough., Disp: 120 mL, Rfl: 0   Family History  Problem Relation Age of Onset  . Hypertension Mother   . Diabetes Mother   . Hypertension Father   . Cancer Father        melonoma  . Heart disease Father        A-fib  . Lung cancer Father 58  . Melanoma Father   . Breast cancer Neg Hx      Social History   Tobacco Use  . Smoking status: Never Smoker  . Smokeless tobacco: Never Used  Vaping Use  . Vaping Use: Never used  Substance Use Topics  . Alcohol use: Yes    Comment: rarely  . Drug  use: No    Allergies as of 08/05/2020 - Review Complete 08/05/2020  Allergen Reaction Noted  . Hydrocodone-acetaminophen  10/30/2009  . Other Itching and Swelling 04/15/2018  . Penicillins  10/30/2009  . Sulfonamide derivatives  10/30/2009  . Vancomycin  10/30/2009    Review of Systems:    All systems reviewed and negative except where noted in HPI.   Physical Exam:  BP 112/77 (BP Location: Left Arm, Patient Position: Sitting, Cuff Size: Normal)   Pulse 64   Temp 97.9 F (36.6 C) (Oral)   Ht 5\' 5"  (1.651 m)   Wt 186 lb 8 oz (84.6 kg)   BMI 31.04 kg/m  No LMP recorded.  General:   Alert,  Well-developed, well-nourished, pleasant and cooperative in NAD Head:  Normocephalic and atraumatic. Eyes:  Sclera clear, no icterus.    Conjunctiva pink. Ears:  Normal auditory acuity. Nose:  No deformity, discharge, or lesions. Mouth:  No deformity or lesions,oropharynx pink & moist. Neck:  Supple; no masses or thyromegaly. Lungs:  Respirations even and unlabored.  Clear throughout to auscultation.   No wheezes, crackles, or rhonchi. No acute distress. Heart:  Regular rate and rhythm; no murmurs, clicks, rubs, or gallops. Abdomen:  Normal bowel sounds. Soft, non-tender and mildly distended without masses, hepatosplenomegaly or hernias noted.  No guarding or rebound tenderness, scars from prior laparoscopic surgery.   Rectal: Decreased anal sphincter tone Msk:  Symmetrical without gross deformities. Good, equal movement & strength bilaterally. Pulses:  Normal pulses noted. Extremities:  No clubbing or edema.  No cyanosis. Neurologic:  Alert and oriented x3;  grossly normal neurologically. Skin:  Intact without significant lesions or rashes. No jaundice. Psych:  Alert and cooperative. Normal mood and affect.  Imaging Studies: No abdominal imaging  Assessment and Plan:   Stephanie Boyd is a 45 y.o. pleasant Caucasian female with history of Roux-en-Y gastric bypass in 2019 is seen in consultation for nonbloody soft stools as well as anal seepage associated with intermittent abdominal bloating.  Her symptoms are likely combination of loose consistency of the stool as well as decreased anal sphincter tone.  Given her history of Roux-en-Y gastric bypass, the differentials for loose stools are secondary pancreatic insufficiency or small intestine bacterial overgrowth.  Recommend pancreatic fecal elastase levels as well as empiric trial of pancreatic enzymes, samples provided today.  If this does not help, will empirically treat for bacterial overgrowth.  I have also discussed with her about Kegel exercises to improve anal sphincter tone.  If this does not work, we can refer her to colorectal surgeon for an expert consultation.  She  will need anorectal manometry prior to referral to colorectal surgeon  History of Roux-en-Y gastric bypass Recheck labs today Encouraged to take bariatric multivitamin with minerals daily   Follow up in 3 months   2020, MD

## 2020-08-05 NOTE — Progress Notes (Deleted)
Arlyss Repress, MD [address]  [address]  [place], [place] [address]  Main: [phone]  Fax: [phone]    Gastroenterology Consultation  Referring Provider:     Sherlene Shams, MD Primary Care Physician:  Sherlene Shams, MD Primary Gastroenterologist:  Dr. Arlyss Repress Reason for Consultation:     ***        HPI:   Stephanie Boyd is a 45 y.o. female referred by Dr. Sherlene Shams, MD  for consultation & management of ***  NSAIDs: ***  Antiplts/Anticoagulants/Anti thrombotics: ***  GI Procedures: ***  Past Medical History:  Diagnosis Date  . Allergic rhinitis   . Heart murmur   . History of chicken pox   . Menorrhagia   . Migraines   . Urinary frequency     Past Surgical History:  Procedure Laterality Date  . ABDOMINOPLASTY N/A July 2015  . ABLATION    . GASTRIC BYPASS    . HERNIA REPAIR    . LAMINECTOMY  11/2008   L4-5  . lapband  08/2009   and gastric plication  . TUBAL LIGATION  2007  . UMBILICAL HERNIA REPAIR  01/2007    Prior to Admission medications   Medication Sig Start Date End Date Taking? Authorizing Provider  ALPRAZolam Prudy Feeler) 0.5 MG tablet Take 1 tablet (0.5 mg total) by mouth at bedtime as needed for sleep. 08/03/20  Yes Sherlene Shams, MD  butalbital-acetaminophen-caffeine (FIORICET, ESGIC) [phone] MG tablet One tablet every 4 to 6 hours as needed for headache 09/04/18  Yes Sherlene Shams, MD  fluticasone (FLONASE) 50 MCG/ACT nasal spray Place 1 spray into both nostrils daily. 11/27/19  Yes [provider]  guaiFENesin-codeine (CHERATUSSIN AC) 100-10 MG/5ML syrup Take 5 mLs by mouth 3 (three) times daily as needed for cough. 07/27/20  Yes Sherlene Shams, MD    Family History  Problem Relation Age of Onset  . Hypertension Mother   . Diabetes Mother   . Hypertension Father   . Cancer Father        melonoma  . Heart disease Father        A-fib  . Lung cancer Father 15  . Melanoma Father   . Breast cancer Neg Hx       Social History   Tobacco Use  . Smoking status: Never Smoker  . Smokeless tobacco: Never Used  Vaping Use  . Vaping Use: Never used  Substance Use Topics  . Alcohol use: Yes    Comment: rarely  . Drug use: No    Allergies as of 08/05/2020 - Review Complete 08/05/2020  Allergen Reaction Noted  . Hydrocodone-acetaminophen  10/30/2009  . Other Itching and Swelling 04/15/2018  . Penicillins  10/30/2009  . Sulfonamide derivatives  10/30/2009  . Vancomycin  10/30/2009    Review of Systems:    All systems reviewed and negative except where noted in HPI.   Physical Exam:  BP 112/77 (BP Location: Left Arm, Patient Position: Sitting, Cuff Size: Normal)   Pulse 64   Temp 97.9 F (36.6 C) (Oral)   Ht 5\' 5"  (1.651 m)   Wt 186 lb 8 oz (84.6 kg)   BMI 31.04 kg/m  No LMP recorded.  General:   Alert,  Well-developed, well-nourished, pleasant and cooperative in NAD Head:  Normocephalic and atraumatic. Eyes:  Sclera clear, no icterus.   Conjunctiva pink. Ears:  Normal auditory acuity. Nose:  No deformity, discharge, or lesions. Mouth:  No deformity or lesions,oropharynx pink & moist. Neck:  Supple; no masses or thyromegaly. Lungs:  Respirations even and unlabored.  Clear throughout to auscultation.   No wheezes, crackles, or rhonchi. No acute distress. Heart:  Regular rate and rhythm; no murmurs, clicks, rubs, or gallops. Abdomen:  Normal bowel sounds. Soft, non-tender and non-distended without masses, hepatosplenomegaly or hernias noted.  No guarding or rebound tenderness.   Rectal: Not performed Msk:  Symmetrical without gross deformities. Good, equal movement & strength bilaterally. Pulses:  Normal pulses noted. Extremities:  No clubbing or edema.  No cyanosis. Neurologic:  Alert and oriented x3;  grossly normal neurologically. Skin:  Intact without significant lesions or rashes. No jaundice. Lymph Nodes:  No significant cervical adenopathy. Psych:  Alert and cooperative.  Normal mood and affect.  Imaging Studies: ***  Assessment and Plan:   Stephanie Boyd is a 45 y.o. female with ***   Follow up in ***   Arlyss Repress, MD

## 2020-08-07 LAB — COMPREHENSIVE METABOLIC PANEL
ALT: 20 IU/L (ref 0–32)
AST: 24 IU/L (ref 0–40)
Albumin/Globulin Ratio: 1.8 (ref 1.2–2.2)
Albumin: 4.3 g/dL (ref 3.8–4.8)
Alkaline Phosphatase: 66 IU/L (ref 44–121)
BUN/Creatinine Ratio: 24 — ABNORMAL HIGH (ref 9–23)
BUN: 15 mg/dL (ref 6–24)
Bilirubin Total: 0.3 mg/dL (ref 0.0–1.2)
CO2: 21 mmol/L (ref 20–29)
Calcium: 9.4 mg/dL (ref 8.7–10.2)
Chloride: 103 mmol/L (ref 96–106)
Creatinine, Ser: 0.63 mg/dL (ref 0.57–1.00)
Globulin, Total: 2.4 g/dL (ref 1.5–4.5)
Glucose: 95 mg/dL (ref 65–99)
Potassium: 4.7 mmol/L (ref 3.5–5.2)
Sodium: 139 mmol/L (ref 134–144)
Total Protein: 6.7 g/dL (ref 6.0–8.5)
eGFR: 111 mL/min/{1.73_m2} (ref >59)

## 2020-08-07 LAB — B12 AND FOLATE PANEL
Folate: 6.2 ng/mL (ref >3.0)
Vitamin B-12: 250 pg/mL (ref 232–1245)

## 2020-08-07 LAB — CBC
Hematocrit: 39.8 % (ref 34.0–46.6)
Hemoglobin: 13.2 g/dL (ref 11.1–15.9)
MCH: 28.7 pg (ref 26.6–33.0)
MCHC: 33.2 g/dL (ref 31.5–35.7)
MCV: 87 fL (ref 79–97)
Platelets: 322 10*3/uL (ref 150–450)
RBC: 4.6 x10E6/uL (ref 3.77–5.28)
RDW: 12.4 % (ref 11.7–15.4)
WBC: 7 10*3/uL (ref 3.4–10.8)

## 2020-08-07 LAB — IRON,TIBC AND FERRITIN PANEL
Ferritin: 13 ng/mL — ABNORMAL LOW (ref 15–150)
Iron Saturation: 27 % (ref 15–55)
Iron: 104 ug/dL (ref 27–159)
Total Iron Binding Capacity: 385 ug/dL (ref 250–450)
UIBC: 281 ug/dL (ref 131–425)

## 2020-08-07 LAB — COPPER, SERUM: Copper: 115 ug/dL (ref 80–158)

## 2020-08-08 ENCOUNTER — Other Ambulatory Visit: Payer: Self-pay | Admitting: Gastroenterology

## 2020-08-08 DIAGNOSIS — Z9884 Bariatric surgery status: Secondary | ICD-10-CM | POA: Diagnosis not present

## 2020-08-10 LAB — PANCREATIC ELASTASE, FECAL: Pancreatic Elastase, Fecal: 167 ug Elast./g — ABNORMAL LOW (ref >200)

## 2020-11-08 ENCOUNTER — Ambulatory Visit: Payer: BC Managed Care – PPO | Admitting: Gastroenterology

## 2020-11-25 ENCOUNTER — Other Ambulatory Visit: Payer: Self-pay | Admitting: Internal Medicine

## 2021-02-02 DIAGNOSIS — H16223 Keratoconjunctivitis sicca, not specified as Sjogren's, bilateral: Secondary | ICD-10-CM | POA: Diagnosis not present

## 2021-05-03 ENCOUNTER — Telehealth (INDEPENDENT_AMBULATORY_CARE_PROVIDER_SITE_OTHER): Payer: BC Managed Care – PPO | Admitting: Family

## 2021-05-03 VITALS — Ht 65.0 in

## 2021-05-03 DIAGNOSIS — J029 Acute pharyngitis, unspecified: Secondary | ICD-10-CM | POA: Diagnosis not present

## 2021-05-03 MED ORDER — LIDOCAINE VISCOUS HCL 2 % MT SOLN: 15.0000 mL | OROMUCOSAL | 0 refills | Status: DC | PRN

## 2021-05-03 MED ORDER — AZITHROMYCIN 250 MG PO TABS: ORAL_TABLET | ORAL | 0 refills | Status: DC

## 2021-05-03 NOTE — Progress Notes (Signed)
Virtual Visit via Video Note  I connected with@  on 05/03/21 at 10:00 AM EST by a video enabled telemedicine application and verified that I am speaking with the correct person using two identifiers.  Location patient: home Location provider:work  Persons participating in the virtual visit: patient, provider  I discussed the limitations of evaluation and management by telemedicine and the availability of in person appointments. The patient expressed understanding and agreed to proceed.   HPI: Acute visit.  Complains of bilateral sore throat, left side is worse. Symptoms started 4 days ago.  Endorses chills, mild HA, tactile warmth.  She states that her husband looked in her throat and could see ' white pus' on both tonsils.  No cough, sob, trouble managing secretions.  Children are well.  No recent antibiotic.     ROS: See pertinent positives and negatives per HPI.    EXAM:  VITALS per patient if applicable: Ht 5\' 5"  (1.651 m)   BMI 31.04 kg/m  BP Readings from Last 3 Encounters:  08/05/20 112/77  06/30/20 120/80  12/14/19 104/78   Wt Readings from Last 3 Encounters:  08/05/20 186 lb 8 oz (84.6 kg)  07/27/20 175 lb (79.4 kg)  06/30/20 184 lb (83.5 kg)    GENERAL: alert, oriented, appears well and in no acute distress  HEENT: atraumatic, conjunttiva clear, no obvious abnormalities on inspection of external nose and ears  NECK: normal movements of the head and neck  LUNGS: on inspection no signs of respiratory distress, breathing rate appears normal, no obvious gross SOB, gasping or wheezing  CV: no obvious cyanosis  MS: moves all visible extremities without noticeable abnormality  PSYCH/NEURO: pleasant and cooperative, no obvious depression or anxiety, speech and thought processing grossly intact  ASSESSMENT AND PLAN:  Discussed the following assessment and plan:  Problem List Items Addressed This Visit       Respiratory   Pharyngitis - Primary     Tactile warmth. Worse left side. Unable to assess well over video. 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 Reasonable to empirically treat for strep. PCN allergic. Start zpak. Advised probiotics. She will let me know how she is doing.      Relevant Medications   lidocaine (XYLOCAINE) 2 % solution   azithromycin (ZITHROMAX) 250 MG tablet    -we discussed possible serious and likely etiologies, options for evaluation and workup, limitations of telemedicine visit vs in person visit, treatment, treatment risks and precautions. Pt prefers to treat via telemedicine empirically rather then risking or undertaking an in person visit at this moment.  .   I discussed the assessment and treatment plan with the patient. The patient was provided an opportunity to ask questions and all were answered. The patient agreed with the plan and demonstrated an understanding of the instructions.   The patient was advised to call back or seek an in-person evaluation if the symptoms worsen or if the condition fails to improve as anticipated.   07/02/20, FNP

## 2021-05-03 NOTE — Assessment & Plan Note (Signed)
Tactile warmth. Worse left side. Unable to assess well over video. 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 Reasonable to empirically treat for strep. PCN allergic. Start zpak. Advised probiotics. She will let me know how she is doing.

## 2021-05-03 NOTE — Patient Instructions (Signed)
Start zpak.  May use lidocaine swish and swallow as needed. Ensure to take probiotics while on antibiotics and also for 2 weeks after completion. This can either be by eating yogurt daily or taking a probiotic supplement over the counter such as Culturelle.It is important to re-colonize the gut with good bacteria and also to prevent any diarrheal infections associated with antibiotic use.   If symptoms were to worsen, you develop trouble swallowing, difficulty managing secretions, please let us know right away.  You may require an in person visit urgent care for you safety.

## 2021-05-08 ENCOUNTER — Encounter: Payer: Self-pay | Admitting: Family

## 2021-05-08 NOTE — Telephone Encounter (Signed)
Spoke to pt and relayed Margaret's message. Pt agreeable to go to UC to be seen and evaluated.

## 2021-05-09 DIAGNOSIS — J029 Acute pharyngitis, unspecified: Secondary | ICD-10-CM | POA: Diagnosis not present

## 2021-05-10 ENCOUNTER — Other Ambulatory Visit: Payer: Self-pay | Admitting: Internal Medicine

## 2021-05-10 MED ORDER — NYSTATIN 100000 UNIT/ML MT SUSP: 5.0000 mL | Freq: Four times a day (QID) | OROMUCOSAL | 0 refills | Status: DC

## 2021-05-10 MED ORDER — LEVOFLOXACIN 500 MG PO TABS: 500.0000 mg | ORAL_TABLET | Freq: Every day | ORAL | 0 refills | Status: AC

## 2021-05-10 MED ORDER — PREDNISONE 10 MG PO TABS: ORAL_TABLET | ORAL | 0 refills | Status: DC

## 2021-08-09 ENCOUNTER — Other Ambulatory Visit: Payer: Self-pay

## 2021-08-09 ENCOUNTER — Ambulatory Visit (INDEPENDENT_AMBULATORY_CARE_PROVIDER_SITE_OTHER): Payer: BC Managed Care – PPO | Admitting: Obstetrics & Gynecology

## 2021-08-09 ENCOUNTER — Other Ambulatory Visit (HOSPITAL_COMMUNITY)
Admission: RE | Admit: 2021-08-09 | Discharge: 2021-08-09 | Disposition: A | Discharge status: Home / Self Care | Payer: BC Managed Care – PPO | Source: Ambulatory Visit | Attending: Obstetrics & Gynecology | Admitting: Obstetrics & Gynecology

## 2021-08-09 ENCOUNTER — Encounter: Payer: Self-pay | Admitting: Obstetrics & Gynecology

## 2021-08-09 VITALS — BP 120/70 | Ht 65.0 in | Wt 188.0 lb

## 2021-08-09 DIAGNOSIS — Z124 Encounter for screening for malignant neoplasm of cervix: Secondary | ICD-10-CM | POA: Diagnosis not present

## 2021-08-09 DIAGNOSIS — Z01419 Encounter for gynecological examination (general) (routine) without abnormal findings: Secondary | ICD-10-CM | POA: Diagnosis not present

## 2021-08-09 DIAGNOSIS — Z1231 Encounter for screening mammogram for malignant neoplasm of breast: Secondary | ICD-10-CM | POA: Diagnosis not present

## 2021-08-09 MED ORDER — BUTALBITAL-APAP-CAFFEINE 50-325-40 MG PO CAPS
1.0000 | ORAL_CAPSULE | Freq: Four times a day (QID) | ORAL | 0 refills | Status: DC | PRN
Start: 2021-08-09 — End: 2021-08-09

## 2021-08-09 MED ORDER — BUTALBITAL-APAP-CAFFEINE 50-325-40 MG PO CAPS: 1.0000 | ORAL_CAPSULE | Freq: Four times a day (QID) | ORAL | 0 refills | Status: DC | PRN

## 2021-08-09 NOTE — Progress Notes (Signed)
? ?HPI: ?     [name] is a 46 y.o. [id number] who LMP was Patient's last menstrual period was 06/17/2021., she presents today for her annual examination. The patient has no complaints today. Spaced out periods, mild HF,NS sx's of menopause.  The patient is sexually active. Her last pap: approximate date 2020 and was normal and last mammogram: approximate date 2021 and was normal. The patient does perform self breast exams.  There is no notable family history of breast or ovarian cancer in her family.  The patient has regular exercise: yes.  The patient denies current symptoms of depression.   ? ?GYN History: ?Contraception: tubal ligation ? ?PMHx: ?Past Medical History:  ?Diagnosis Date  ? Allergic rhinitis   ? Heart murmur   ? History of chicken pox   ? Menorrhagia   ? Migraines   ? Urinary frequency   ? ?Past Surgical History:  ?Procedure Laterality Date  ? ABDOMINOPLASTY N/A July 2015  ? ABLATION    ? GASTRIC BYPASS    ? HERNIA REPAIR    ? LAMINECTOMY  11/2008  ? L4-5  ? lapband  08/2009  ? and gastric plication  ? TUBAL LIGATION  2007  ? UMBILICAL HERNIA REPAIR  01/2007  ? ?Family History  ?Problem Relation Age of Onset  ? Hypertension Mother   ? Diabetes Mother   ? Hypertension Father   ? Cancer Father   ?     melonoma  ? Heart disease Father   ?     A-fib  ? Lung cancer Father 39  ? Melanoma Father   ? Breast cancer Neg Hx   ? ?Social History  ? ?Tobacco Use  ? Smoking status: Never  ? Smokeless tobacco: Never  ?Vaping Use  ? Vaping Use: Never used  ?Substance Use Topics  ? Alcohol use: Yes  ?  Comment: rarely  ? Drug use: No  ? ? ?Current Outpatient Medications:  ?  ALPRAZolam (XANAX) 0.5 MG tablet, Take 1 tablet (0.5 mg total) by mouth at bedtime as needed for sleep., Disp: 30 tablet, Rfl: 5 ?  Butalbital-APAP-Caffeine 50-325-40 MG capsule, Take 1-2 capsules by mouth every 6 (six) hours as needed for headache., Disp: 30 capsule, Rfl: 0 ?  fluticasone (FLONASE) 50 MCG/ACT nasal spray, Place 1 spray  into both nostrils daily., Disp: , Rfl:  ?  azithromycin (ZITHROMAX) 250 MG tablet, Tale 500 mg PO on day 1, then 250 mg PO q24h x 4 days., Disp: 6 tablet, Rfl: 0 ?  lidocaine (XYLOCAINE) 2 % solution, Use as directed 15 mLs in the mouth or throat every 3 (three) hours as needed for mouth pain (gargle; may spit or swallow)., Disp: 100 mL, Rfl: 0 ?  nystatin (MYCOSTATIN) 100000 UNIT/ML suspension, Take 5 mLs (500,000 Units total) by mouth 4 (four) times daily., Disp: 60 mL, Rfl: 0 ?  predniSONE (DELTASONE) 10 MG tablet, 6 tablets on Day 1 , then reduce by 1 tablet daily until gone, Disp: 21 tablet, Rfl: 0 ?Allergies: Hydrocodone-acetaminophen, Other, Penicillins, Sulfonamide derivatives, and Vancomycin ? ?Review of Systems  ?Constitutional:  Negative for chills, fever and malaise/fatigue.  ?HENT:  Negative for congestion, sinus pain and sore throat.   ?Eyes:  Negative for blurred vision and pain.  ?Respiratory:  Negative for cough and wheezing.   ?Cardiovascular:  Negative for chest pain and leg swelling.  ?Gastrointestinal:  Negative for abdominal pain, constipation, diarrhea, heartburn, nausea and vomiting.  ?Genitourinary:  Negative for dysuria,  frequency, hematuria and urgency.  ?Musculoskeletal:  Negative for back pain, joint pain, myalgias and neck pain.  ?Skin:  Negative for itching and rash.  ?Neurological:  Negative for dizziness, tremors and weakness.  ?Endo/Heme/Allergies:  Does not bruise/bleed easily.  ?Psychiatric/Behavioral:  Negative for depression. The patient is not nervous/anxious and does not have insomnia.   ? ?Objective: ?BP 120/70   Ht 5\' 5"  (1.651 m)   Wt 188 lb (85.3 kg)   LMP 06/17/2021   BMI 31.28 kg/m?   ?Filed Weights  ? 08/09/21 0927  ?Weight: 188 lb (85.3 kg)  ? Body mass index is 31.28 kg/m?10/09/21 ?Physical Exam ?Constitutional:   ?   General: She is not in acute distress. ?   Appearance: She is well-developed.  ?Genitourinary:  ?   Bladder, rectum and urethral meatus normal.  ?   No  lesions in the vagina.  ?   Right Labia: No rash, tenderness or lesions. ?   Left Labia: No tenderness, lesions or rash. ?   No vaginal bleeding.  ? ?   Right Adnexa: not tender and no mass present. ?   Left Adnexa: not tender and no mass present. ?   No cervical motion tenderness, friability, lesion or polyp.  ?   Uterus is not enlarged.  ?   No uterine mass detected. ?   Pelvic exam was performed with patient in the lithotomy position.  ?Breasts: ?   Right: No mass, skin change or tenderness.  ?   Left: No mass, skin change or tenderness.  ?HENT:  ?   Head: Normocephalic and atraumatic. No laceration.  ?   Right Ear: Hearing normal.  ?   Left Ear: Hearing normal.  ?   Mouth/Throat:  ?   Pharynx: Uvula midline.  ?Eyes:  ?   Pupils: Pupils are equal, round, and reactive to light.  ?Neck:  ?   Thyroid: No thyromegaly.  ?Cardiovascular:  ?   Rate and Rhythm: Normal rate and regular rhythm.  ?   Heart sounds: No murmur heard. ?  No friction rub. No gallop.  ?Pulmonary:  ?   Effort: Pulmonary effort is normal. No respiratory distress.  ?   Breath sounds: Normal breath sounds. No wheezing.  ?Abdominal:  ?   General: Bowel sounds are normal. There is no distension.  ?   Palpations: Abdomen is soft.  ?   Tenderness: There is no abdominal tenderness. There is no rebound.  ?Musculoskeletal:     ?   General: Normal range of motion.  ?   Cervical back: Normal range of motion and neck supple.  ?Neurological:  ?   Mental Status: She is alert and oriented to person, place, and time.  ?   Cranial Nerves: No cranial nerve deficit.  ?Skin: ?   General: Skin is warm and dry.  ?Psychiatric:     ?   Judgment: Judgment normal.  ?Vitals reviewed.  ? ? ?Assessment:  ANNUAL EXAM ?1. Women's annual routine gynecological examination   ?2. Encounter for screening mammogram for malignant neoplasm of breast   ?3. Screening for cervical cancer   ? ? ? ?Screening Plan: ?           ?1.  Cervical Screening-  Pap smear done today ? ?2. Breast  screening- Exam annually and mammogram>40 planned  ? ?3. Colonoscopy every 10 years, Hemoccult testing - after age 64 ? ?4. Labs managed by PCP ? ?5. Counseling for contraception: bilateral tubal ligation ?  [facility] - 08/09/21 0929   ? ?  ? Pregnancy Intention Screening  ? Does the patient want to become pregnant in the next year? No   ? Does the patient's partner want to become pregnant in the next year? No   ? Would the patient like to discuss contraceptive options today? No   ?  ? Contraception Wrap Up  ? Current Method Female Sterilization   ? End Method Female Sterilization   ? Contraception Counseling Provided No   ? ?  ?  ? ?  ? ?  F/U ? Return in about 1 year (around 08/10/2022) for Annual. ? ?Annamarie MajorPaul Devansh Riese, MD, FACOG ?[facility], [facility] ?08/09/2021  10:15 AM ? ? ?

## 2021-08-09 NOTE — Patient Instructions (Signed)
PAP every three years ?Mammogram every year ?   Call [phone] to schedule at [facility] ?Colonoscopy every 10 years ?Labs yearly (with PCP) ? ?Thank you for choosing [facility]. As part of our ongoing efforts to improve patient experience, we would appreciate your feedback. Please fill out the short survey that you will receive by mail or MyChart. Your opinion is important to [place]! ?- Dr. Tiburcio Pea ? ?Black Cohosh, Cimicifuga racemosa oral dosage forms ?What is this medication? ?BLACK COHOSH (blak  KOH hosh) or Cimicifuga racemosa is a dietary supplement. It is promoted to relieve symptoms of menopause, such as hot flashes. The FDA has not approved this supplement for any medical use. ?This supplement may be used for other purposes; ask your health care provider or pharmacist if you have questions. ?This medicine may be used for other purposes; ask your health care provider or pharmacist if you have questions. ?What should I tell my care team before I take this medication? ?They need to know if you have any of these conditions: ?breast cancer ?cervical, ovarian or uterine cancer ?high blood pressure ?infertility ?liver disease ?menstrual changes or irregular periods ?unusual vaginal or uterine bleeding ?an unusual or allergic reaction to black cohosh, soybeans, tartrazine dye (yellow dye number 5), other medicines, foods, dyes, or preservatives ?pregnant or trying to get pregnant ?breast-feeding ?How should I use this medication? ?Take this herb by mouth with a glass of water. Follow the directions on the package labeling, or talk to your health care professional. Do not use for longer than 6 months without the advice of a health care professional. Do not use if you are pregnant or breast-feeding. Talk to your obstetrician-gynecologist or certified nurse-midwife. ?This herb is not for use in children under the age of 18 years. ?Overdosage: If you think you have taken too much of this medicine contact a poison  control center or emergency room at once. ?NOTE: This medicine is only for you. Do not share this medicine with others. ?What if I miss a dose? ?If you miss a dose, take it as soon as you can. If it is almost time for your next dose, take only that dose. Do not take double or extra doses. ?What may interact with this medication? ?atorvastatin ?cisplatin ?fertility treatments ?This list may not describe all possible interactions. Give your health care provider a list of all the medicines, herbs, non-prescription drugs, or dietary supplements you use. Also tell them if you smoke, drink alcohol, or use illegal drugs. Some items may interact with your medicine. ?What should I watch for while using this medication? ?Since this herb is derived from a plant, allergic reactions are possible. Stop using this herb if you develop a rash. You may need to see your health care professional, or inform them that this occurred. Report any unusual side effects promptly. ?If you are taking this herb for menstrual or menopausal symptoms, visit your doctor or health care professional for regular checks on your progress. You should have a complete check-up every 6 months. You will need a regular breast and pelvic exam while on this therapy. Follow the advice of your doctor or health care professional. ?Women should inform their doctor if they wish to become pregnant or think they might be pregnant. If you have any reason to think you are pregnant, stop taking this herb at once and contact your doctor or health care professional. ?Herbal or dietary supplements are not regulated like medicines. Rigid quality control standards are not required  for dietary supplements. The purity and strength of these products can vary. The safety and effect of this dietary supplement for a certain disease or illness is not well known. This product is not intended to diagnose, treat, cure or prevent any disease. ?The Food and Drug Administration suggests the  following to help consumers protect themselves: ?Always read product labels and follow directions. ?Natural does not mean a product is safe for humans to take. ?Look for products that include USP after the ingredient name. This means that the manufacturer followed the standards of the [place].S. Pharmacopoeia. ?Supplements made or sold by a nationally known food or drug company are more likely to be made under tight controls. You can write to the company for more information about how the product was made. ?What side effects may I notice from receiving this medication? ?Side effects that you should report to your doctor or health care professional as soon as possible: ?allergic reactions like skin rash, itching or hives, swelling of the face, lips, or tongue ?breathing problems ?dizziness ?palpitations ?signs and symptoms of liver injury like dark yellow or brown urine; general ill feeling or flu-like symptoms; light-colored stools; loss of appetite; nausea; right upper belly pain; unusually weak or tired; yellowing of the eyes or skin ?unusual vaginal bleeding ?Side effects that usually do not require medical attention (report to your doctor or health care professional if they continue or are bothersome): ?breast tenderness ?headache ?nausea ?upset stomach ?This list may not describe all possible side effects. Call your doctor for medical advice about side effects. You may report side effects to FDA at 1-800-FDA-1088. ?Where should I keep my medication? ?Keep out of the reach of children. ?Store at room temperature between 15 and 30 degrees C (59 and 86 degrees C). Throw away any unused herb after the expiration date. ?NOTE: This sheet is a summary. It may not cover all possible information. If you have questions about this medicine, talk to your doctor, pharmacist, or health care provider. ?? 2022 Elsevier/Gold Standard (2015-11-30 00:00:00) ? ?

## 2021-08-10 LAB — CYTOLOGY - PAP
Comment: NEGATIVE
Diagnosis: NEGATIVE
High risk HPV: NEGATIVE

## 2021-08-17 ENCOUNTER — Ambulatory Visit (INDEPENDENT_AMBULATORY_CARE_PROVIDER_SITE_OTHER): Payer: BC Managed Care – PPO

## 2021-08-17 ENCOUNTER — Encounter: Payer: Self-pay | Admitting: Internal Medicine

## 2021-08-17 ENCOUNTER — Ambulatory Visit: Payer: BC Managed Care – PPO | Admitting: Internal Medicine

## 2021-08-17 ENCOUNTER — Other Ambulatory Visit: Payer: Self-pay

## 2021-08-17 VITALS — BP 108/64 | HR 80 | Temp 97.9°F | Ht 65.0 in | Wt 185.4 lb

## 2021-08-17 DIAGNOSIS — M79602 Pain in left arm: Secondary | ICD-10-CM | POA: Diagnosis not present

## 2021-08-17 DIAGNOSIS — M25512 Pain in left shoulder: Secondary | ICD-10-CM | POA: Diagnosis not present

## 2021-08-17 DIAGNOSIS — M79622 Pain in left upper arm: Secondary | ICD-10-CM

## 2021-08-17 DIAGNOSIS — F419 Anxiety disorder, unspecified: Secondary | ICD-10-CM

## 2021-08-17 DIAGNOSIS — F5105 Insomnia due to other mental disorder: Secondary | ICD-10-CM

## 2021-08-17 DIAGNOSIS — J0111 Acute recurrent frontal sinusitis: Secondary | ICD-10-CM | POA: Diagnosis not present

## 2021-08-17 MED ORDER — ALPRAZOLAM 0.5 MG PO TABS: 0.5000 mg | ORAL_TABLET | Freq: Every evening | ORAL | 5 refills | Status: DC | PRN

## 2021-08-17 MED ORDER — PREDNISONE 10 MG PO TABS: ORAL_TABLET | ORAL | 0 refills | Status: DC

## 2021-08-17 MED ORDER — FLUTICASONE PROPIONATE 50 MCG/ACT NA SUSP: 1.0000 | Freq: Every day | NASAL | 2 refills | Status: DC

## 2021-08-17 MED ORDER — LEVOFLOXACIN 500 MG PO TABS: 500.0000 mg | ORAL_TABLET | Freq: Every day | ORAL | 0 refills | Status: DC

## 2021-08-17 MED ORDER — MONTELUKAST SODIUM 10 MG PO TABS: 10.0000 mg | ORAL_TABLET | Freq: Every day | ORAL | 3 refills | Status: DC

## 2021-08-17 NOTE — Progress Notes (Signed)
? ?Subjective:  ?Patient ID: Stephanie Boyd, female    DOB: August 09, 1975  Age: 46 y.o. MRN: [id number] ? ?CC: The primary encounter diagnosis was Left upper arm pain. Diagnoses of Acute recurrent frontal sinusitis, Left arm pain, and Insomnia secondary to anxiety were also pertinent to this visit. ? ? ?This visit occurred during the SARS-CoV-2 public health emergency.  Safety protocols were in place, including screening questions prior to the visit, additional usage of staff PPE, and extensive cleaning of exam room while observing appropriate contact time as indicated for disinfecting solutions.   ? ?HPI ?Stephanie Boyd presents for   1) left arm pain for the past month 2) recurrent sinusitis 3) insomnia needing refill  ?Chief Complaint  ?Patient presents with  ? Acute Visit  ?  Shoulder pain and headaches  ? ? ?1) shoulder pain Started 2 moths ago in the triceps/ deltoid muscle  no unusual activity or injections in arm.  Hurt with activity.  Radiates to middle finger.  Aggravated by driving.  Using the middle finger to turn directional on causes pain to shoot up to her shoulder.  No current neck pain since buying sleep number bed last year.  ? ? ? ? ?2) bilateral frontal sinus pain and pressure started a few weeks ago with pollen season.  Both ears feeling full,  and hearing is affected ?Started with allergy symptoms: sneezing and rhinitis despite allegra D  twice daily. Not using afrin.  Nasal drainage is clear.   ? ?Outpatient Medications Prior to Visit  ?Medication Sig Dispense Refill  ? Butalbital-APAP-Caffeine 50-325-40 MG capsule Take 1-2 capsules by mouth every 6 (six) hours as needed for headache. 30 capsule 0  ? ALPRAZolam (XANAX) 0.5 MG tablet Take 1 tablet (0.5 mg total) by mouth at bedtime as needed for sleep. 30 tablet 5  ? fluticasone (FLONASE) 50 MCG/ACT nasal spray Place 1 spray into both nostrils daily.    ? ?No facility-administered medications prior to visit.  ? ? ?Review of Systems; ? ?Patient  denies headache, fevers, malaise, unintentional weight loss, skin rash, eye pain, sinus congestion and sinus pain, sore throat, dysphagia,  hemoptysis , cough, dyspnea, wheezing, chest pain, palpitations, orthopnea, edema, abdominal pain, nausea, melena, diarrhea, constipation, flank pain, dysuria, hematuria, urinary  Frequency, nocturia, numbness, tingling, seizures,  Focal weakness, Loss of consciousness,  Tremor, insomnia, depression, anxiety, and suicidal ideation.   ? ? ? ?Objective:  ?BP 108/64 (BP Location: Left Arm, Patient Position: Sitting, Cuff Size: Normal)   Pulse 80   Temp 97.9 ?F (36.6 ?C) (Oral)   Ht 5\' 5"  (1.651 m)   Wt 185 lb 6.4 oz (84.1 kg)   SpO2 99%   BMI 30.85 kg/m?  ? ?BP Readings from Last 3 Encounters:  ?08/17/21 108/64  ?08/09/21 120/70  ?08/05/20 112/77  ? ? ?Wt Readings from Last 3 Encounters:  ?08/17/21 185 lb 6.4 oz (84.1 kg)  ?08/09/21 188 lb (85.3 kg)  ?08/05/20 186 lb 8 oz (84.6 kg)  ? ? ?General appearance: alert, cooperative and appears stated age ?Ears:  bilateral bulging TM's and external ear canals both ears ?Oropharynx:  enlarged tonsils  no ulcerations or placque ?Face: bilateral frontal sinus tendernes  ?Throat: lips, mucosa, and tongue normal; teeth and gums normal ?Neck: no adenopathy, no carotid bruit, supple, symmetrical, trachea midline and thyroid not enlarged, symmetric, no tenderness/mass/nodules ?Back: symmetric, no curvature. ROM normal. No CVA tenderness. ?Lungs: clear to auscultation bilaterally ?Ext: mildly tender over biceps tendon.at rest.  Elicited mild lateral scapular and triceps pain with passive elevation, abduction and internal rotation..  Negative Tinel's/Phalen's sign  ?Heart: regular rate and rhythm, S1, S2 normal, no murmur, click, rub or gallop ?Abdomen: soft, non-tender; bowel sounds normal; no masses,  no organomegaly ?Pulses: 2+ and symmetric ?Skin: Skin color, texture, turgor normal. No rashes or lesions ?Lymph nodes: Cervical,  supraclavicular, and axillary nodes normal. ? ?No results found for: HGBA1C ? ?Lab Results  ?Component Value Date  ? CREATININE 0.63 08/05/2020  ? CREATININE 0.52 09/30/2014  ? CREATININE 0.7 08/04/2012  ? ? ?Lab Results  ?Component Value Date  ? WBC 7.0 08/05/2020  ? HGB 13.2 08/05/2020  ? HCT 39.8 08/05/2020  ? PLT 322 08/05/2020  ? GLUCOSE 95 08/05/2020  ? CHOL 147 07/01/2019  ? TRIG 99 07/01/2019  ? HDL 45 07/01/2019  ? LDLCALC 84 07/01/2019  ? ALT 20 08/05/2020  ? AST 24 08/05/2020  ? NA 139 08/05/2020  ? K 4.7 08/05/2020  ? CL 103 08/05/2020  ? CREATININE 0.63 08/05/2020  ? BUN 15 08/05/2020  ? CO2 21 08/05/2020  ? TSH 2.420 07/01/2019  ? ? ?No results found. ? ?Assessment & Plan:  ? ?Problem List Items Addressed This Visit   ? ? Insomnia secondary to anxiety  ?  Continues to use Prn alprazolam, used sparingly.  Life and financial stressors cited as cause. The risks and benefits of benzodiazepine use were discussed with patient today including excessive sedation leading to respiratory depression,  impaired thinking/driving, and addiction.  Patient was advised to avoid concurrent use with alcohol, to use medication only as needed and not to share with others  .  ?  ?  ? Relevant Medications  ? ALPRAZolam (XANAX) 0.5 MG tablet  ? Acute recurrent frontal sinusitis  ?  Levaquin, prednisone taper prescribed.  Continueallegra D.  Add flonase and singulair for prevention  ?  ?  ? Relevant Medications  ? levofloxacin (LEVAQUIN) 500 MG tablet  ? predniSONE (DELTASONE) 10 MG tablet  ? fluticasone (FLONASE) 50 MCG/ACT nasal spray  ? Left arm pain  ?  Etiology appears to be radicular and MSK based on history and exam. Plain films of shoulder ordered to rule out osteophyte formation in the glenoid process.  Needs to avoid NSAIDS due to history of lap band surgery . Will be on prednsisone  for concurrent sinusitis  ?  ?  ? ?Other Visit Diagnoses   ? ? Left upper arm pain    -  Primary  ? Relevant Orders  ? DG Shoulder Left   ? ?  ? ? ?I spent 30 minutes dedicated to the care of this patient on the date of this encounter to include pre-visit review of patient's medical history,  most recent imaging studies, Face-to-face time with the patient , and post visit ordering of testing and therapeutics.   ? ?Follow-up: No follow-ups on file. ? ? ?Sherlene Shams, MD ?

## 2021-08-17 NOTE — Assessment & Plan Note (Addendum)
Continues to use Prn alprazolam, used sparingly.  Life and financial stressors cited as cause. The risks and benefits of benzodiazepine use were discussed with patient today including excessive sedation leading to respiratory depression,  impaired thinking/driving, and addiction.  Patient was advised to avoid concurrent use with alcohol, to use medication only as needed and not to share with others  .  ?

## 2021-08-17 NOTE — Assessment & Plan Note (Signed)
Levaquin, prednisone taper prescribed.  Continueallegra D.  Add flonase and singulair for prevention  ?

## 2021-08-17 NOTE — Assessment & Plan Note (Signed)
Etiology appears to be radicular and MSK based on history and exam. Plain films of shoulder ordered to rule out osteophyte formation in the glenoid process.  Needs to avoid NSAIDS due to history of lap band surgery . Will be on prednsisone  for concurrent sinusitis  ?

## 2021-08-17 NOTE — Patient Instructions (Signed)
I am treating you for sinusitis/otitis which is a complication from your  persistent allergies . ? ? I am prescribing an antibiotic (levaquin) and a prednisone taper  To manage the infection and the inflammation in your ear/sinuses.  ? ?I also advise use of the following OTC meds to help with your other symptoms.  ?Ok to use   Sudafed PE  10 to 30 mg every 8 hours for the congestion, you may substitute Afrin nasal spray for the nighttime dose of sudafed PE  If needed to prevent insomnia. ? ?Adding Flonase and Singulair for maintenance and prevention.  Start Flonase on Day 4 of prednisone  ? ? ?If the shoulder feels better on the prednisone , let me know  ?

## 2021-08-31 ENCOUNTER — Encounter: Payer: Self-pay | Admitting: Internal Medicine

## 2021-08-31 DIAGNOSIS — M79602 Pain in left arm: Secondary | ICD-10-CM

## 2021-09-19 ENCOUNTER — Ambulatory Visit
Admission: RE | Admit: 2021-09-19 | Discharge: 2021-09-19 | Disposition: A | Discharge status: Home / Self Care | Payer: BC Managed Care – PPO | Source: Ambulatory Visit | Attending: Obstetrics & Gynecology | Admitting: Obstetrics & Gynecology

## 2021-09-19 DIAGNOSIS — Z1231 Encounter for screening mammogram for malignant neoplasm of breast: Secondary | ICD-10-CM | POA: Insufficient documentation

## 2021-09-29 DIAGNOSIS — M75102 Unspecified rotator cuff tear or rupture of left shoulder, not specified as traumatic: Secondary | ICD-10-CM | POA: Diagnosis not present

## 2021-12-07 ENCOUNTER — Encounter: Payer: Self-pay | Admitting: Internal Medicine

## 2022-05-04 ENCOUNTER — Encounter: Payer: Self-pay | Admitting: Family Medicine

## 2022-05-04 ENCOUNTER — Ambulatory Visit: Payer: BC Managed Care – PPO | Admitting: Family Medicine

## 2022-05-04 VITALS — BP 112/64 | HR 82 | Temp 97.7°F | Ht 65.0 in | Wt 180.2 lb

## 2022-05-04 DIAGNOSIS — J0100 Acute maxillary sinusitis, unspecified: Secondary | ICD-10-CM

## 2022-05-04 DIAGNOSIS — R2231 Localized swelling, mass and lump, right upper limb: Secondary | ICD-10-CM

## 2022-05-04 DIAGNOSIS — R2230 Localized swelling, mass and lump, unspecified upper limb: Secondary | ICD-10-CM | POA: Insufficient documentation

## 2022-05-04 DIAGNOSIS — R220 Localized swelling, mass and lump, head: Secondary | ICD-10-CM

## 2022-05-04 MED ORDER — PREDNISONE 10 MG PO TABS
ORAL_TABLET | ORAL | 0 refills | Status: DC
Start: 2022-05-04 — End: 2022-08-27

## 2022-05-04 MED ORDER — LEVOFLOXACIN 500 MG PO TABS: 500.0000 mg | ORAL_TABLET | Freq: Every day | ORAL | 0 refills | Status: DC

## 2022-05-04 NOTE — Assessment & Plan Note (Addendum)
Reviewed pt's past history /notes today as well as CT scan This is recurrent  Purulent nasal congestion with sinus and palate pain  Px levqauin and prednisone per her usual protocol  Enc f/u with ENT if needed  Continue saline irrigation  Flonase may help also  Update if not starting to improve in a week or if worsening  ER precautions noted

## 2022-05-04 NOTE — Assessment & Plan Note (Signed)
Superficial skin vs soft tissue lump R upper arm Suspect lipoma or cyst Will monitor and have pt see pcp for eval at next visit

## 2022-05-04 NOTE — Assessment & Plan Note (Signed)
R upper scalp Less than 1 cm Suspect cyst  Enc f/u with pcp to monitor Watch for pain or s/s of infection

## 2022-05-04 NOTE — Patient Instructions (Addendum)
Get back on track with flonase   Saline irrigation is good also   Take the levaquin and prednisone as directed   Take your probiotic   The lumps may be cysts and/or fatty growths  Monitor them (if bigger or painful let us know)  Show Dr Darrick Huntsman the next time you go

## 2022-05-04 NOTE — Progress Notes (Signed)
Subjective:    Patient ID: Stephanie Boyd, female    DOB: [birth year], 46 y.o.   MRN: [id number]  HPI 46 yo pt of Dr Derrel Nip presents with sinus symptoms She has a h/o recurrent sinusitis and chronic sinus inflammation with deviated septum  She was tx with levaquin last march  Wt Readings from Last 3 Encounters:  05/04/22 180 lb 4 oz (81.8 kg)  08/17/21 185 lb 6.4 oz (84.1 kg)  08/09/21 188 lb (85.3 kg)   30.00 kg/m  Early this week had a raw throat  Then post nasal drip- thick/ green and yellow   Sinus pain - under eyes  Jaw/gum pain  Pain in ears  Dec hearing  Constant headache since Monday  Severe last night   No fever Has not had a covid test   Over the years otc medicine have not worked well  Uses netti pot Has flonase   Knots on scalp and R arm  Not painful, she just notices them   R upper arm R upper scalp     Allergic to pcn and vanco and sulfa abx   Sees ENT in [place] guarantee relief with surg- ? If may consider it later   Usually takes levaquin and has to have prednisone    Patient Active Problem List   Diagnosis Date Noted   Skin lump of arm 05/04/2022   Lump of scalp 05/04/2022   Acute recurrent frontal sinusitis 08/17/2021   Left arm pain 08/17/2021   Fecal soiling 12/14/2019   Chronic sinusitis 04/27/2019   Pharyngitis 09/06/2018   Gastric band slippage 10/11/2017   LAP-BAND surgery status 09/25/2017   Acute maxillary sinusitis 02/11/2015   Wellness examination 09/26/2014   Insomnia secondary to anxiety 08/04/2012   Overweight (BMI 25.0-29.9) 08/04/2012   S/P bariatric surgery 08/04/2012   Allergic rhinitis 08/04/2012   Past Medical History:  Diagnosis Date   Allergic rhinitis    Heart murmur    History of chicken pox    Menorrhagia    Migraines    Urinary frequency    Past Surgical History:  Procedure Laterality Date   ABDOMINOPLASTY N/A July 2015   ABLATION     GASTRIC BYPASS     HERNIA REPAIR      LAMINECTOMY  11/2008   L4-5   lapband  [id number]   and gastric plication   TUBAL LIGATION  [id number]   UMBILICAL HERNIA REPAIR  01/2007   Social History   Tobacco Use   Smoking status: Never   Smokeless tobacco: Never  Vaping Use   Vaping Use: Never used  Substance Use Topics   Alcohol use: Yes    Comment: rarely   Drug use: No   Family History  Problem Relation Age of Onset   Hypertension Mother    Diabetes Mother    Hypertension Father    Cancer Father        melonoma   Heart disease Father        A-fib   Lung cancer Father 83   Melanoma Father    Breast cancer Neg Hx    Allergies  Allergen Reactions   Hydrocodone-Acetaminophen     REACTION: rash   Other Itching and Swelling    Surgical glue, per patient   Penicillins     REACTION: rash   Sulfonamide Derivatives     REACTION: rash   Vancomycin     REACTION: reddness   Current Outpatient Medications on File  Prior to Visit  Medication Sig Dispense Refill   ALPRAZolam (XANAX) 0.5 MG tablet Take 1 tablet (0.5 mg total) by mouth at bedtime as needed for sleep. 30 tablet 5   Butalbital-APAP-Caffeine 50-325-40 MG capsule Take 1-2 capsules by mouth every 6 (six) hours as needed for headache. 30 capsule 0   fluticasone (FLONASE) 50 MCG/ACT nasal spray Place 1 spray into both nostrils daily. 16 g 2   No current facility-administered medications on file prior to visit.    Review of Systems  Constitutional:  Positive for appetite change. Negative for fatigue and fever.  HENT:  Positive for congestion, ear pain, postnasal drip, rhinorrhea, sinus pressure and sore throat. Negative for nosebleeds.   Eyes:  Negative for pain, redness and itching.  Respiratory:  Positive for cough. Negative for shortness of breath and wheezing.   Cardiovascular:  Negative for chest pain.  Gastrointestinal:  Negative for abdominal pain, diarrhea, nausea and vomiting.  Endocrine: Negative for polyuria.  Genitourinary:  Negative for dysuria,  frequency and urgency.  Musculoskeletal:  Negative for arthralgias and myalgias.  Skin:  Negative for rash and wound.       lumps  Allergic/Immunologic: Negative for immunocompromised state.  Neurological:  Positive for headaches. Negative for dizziness, tremors, syncope, weakness and numbness.  Hematological:  Negative for adenopathy. Does not bruise/bleed easily.  Psychiatric/Behavioral:  Negative for dysphoric mood. The patient is not nervous/anxious.        Objective:   Physical Exam Constitutional:      General: She is not in acute distress.    Appearance: Normal appearance. She is well-developed. She is obese. She is not ill-appearing or diaphoretic.  HENT:     Head: Normocephalic and atraumatic.     Comments: Bilateral maxillary sinus tenderness No swelling     Right Ear: Tympanic membrane and external ear normal.     Left Ear: Tympanic membrane and external ear normal.     Nose: Congestion and rhinorrhea present.     Mouth/Throat:     Pharynx: Oropharynx is clear. No oropharyngeal exudate or posterior oropharyngeal erythema.     Comments: Clear pnd Eyes:     General:        Right eye: No discharge.        Left eye: No discharge.     Conjunctiva/sclera: Conjunctivae normal.     Pupils: Pupils are equal, round, and reactive to light.  Cardiovascular:     Rate and Rhythm: Normal rate and regular rhythm.     Heart sounds: Normal heart sounds.  Pulmonary:     Effort: Pulmonary effort is normal. No respiratory distress.     Breath sounds: Normal breath sounds. No stridor. No wheezing, rhonchi or rales.     Comments: Good air exch No rales or rhonchi Musculoskeletal:     Cervical back: Normal range of motion and neck supple.  Lymphadenopathy:     Cervical: No cervical adenopathy.  Skin:    General: Skin is warm and dry.     Coloration: Skin is not jaundiced or pale.     Findings: No bruising, erythema or rash.     Comments: 1 cm superficial soft lump in R upper arm   No color change or tenderness Mobile   0.5 cm firm lump on R scalp No erythema or tenderness     Neurological:     Mental Status: She is alert.     Cranial Nerves: No cranial nerve deficit.     Coordination: Coordination  normal.  Psychiatric:        Mood and Affect: Mood normal.           Assessment & Plan:   Problem List Items Addressed This Visit       Respiratory   Acute maxillary sinusitis    Reviewed pt's past history /notes today as well as CT scan This is recurrent  Purulent nasal congestion with sinus and palate pain  Px levqauin and prednisone per her usual protocol  Enc f/u with ENT if needed  Continue saline irrigation  Flonase may help also  Update if not starting to improve in a week or if worsening  ER precautions noted       Relevant Medications   predniSONE (DELTASONE) 10 MG tablet   levofloxacin (LEVAQUIN) 500 MG tablet     Musculoskeletal and Integument   Skin lump of arm - Primary    Superficial skin vs soft tissue lump R upper arm Suspect lipoma or cyst Will monitor and have pt see pcp for eval at next visit         Other   Lump of scalp    R upper scalp Less than 1 cm Suspect cyst  Enc f/u with pcp to monitor Watch for pain or s/s of infection

## 2022-08-16 DIAGNOSIS — D649 Anemia, unspecified: Secondary | ICD-10-CM | POA: Diagnosis not present

## 2022-08-16 DIAGNOSIS — R7989 Other specified abnormal findings of blood chemistry: Secondary | ICD-10-CM | POA: Diagnosis not present

## 2022-08-16 DIAGNOSIS — R5382 Chronic fatigue, unspecified: Secondary | ICD-10-CM | POA: Diagnosis not present

## 2022-08-16 DIAGNOSIS — R6882 Decreased libido: Secondary | ICD-10-CM | POA: Diagnosis not present

## 2022-08-16 LAB — COMPREHENSIVE METABOLIC PANEL: eGFR: 111

## 2022-08-16 LAB — IRON,TIBC AND FERRITIN PANEL
%SAT: 21
Ferritin: 25
Iron: 72
TIBC: 339
UIBC: 267

## 2022-08-16 LAB — BASIC METABOLIC PANEL
BUN: 24 — AB (ref 4–21)
Creatinine: 0.6 (ref 0.5–1.1)
Glucose: 95
Sodium: 137 (ref 137–147)

## 2022-08-16 LAB — HEPATIC FUNCTION PANEL: Bilirubin, Total: 0.3

## 2022-08-16 LAB — TSH: TSH: 1.94 (ref 0.41–5.90)

## 2022-08-16 LAB — HEMOGLOBIN A1C: Hemoglobin A1C: 5.5

## 2022-08-27 ENCOUNTER — Encounter: Payer: Self-pay | Admitting: Family

## 2022-08-27 ENCOUNTER — Ambulatory Visit: Payer: BC Managed Care – PPO | Admitting: Family

## 2022-08-27 VITALS — BP 128/84 | HR 90 | Temp 97.6°F | Ht 65.0 in | Wt 179.8 lb

## 2022-08-27 DIAGNOSIS — J0111 Acute recurrent frontal sinusitis: Secondary | ICD-10-CM

## 2022-08-27 MED ORDER — AZITHROMYCIN 250 MG PO TABS: ORAL_TABLET | ORAL | 0 refills | Status: DC

## 2022-08-27 MED ORDER — PREDNISONE 10 MG PO TABS: ORAL_TABLET | ORAL | 0 refills | Status: DC

## 2022-08-27 NOTE — Assessment & Plan Note (Addendum)
Afebrile.  Nontoxic in appearance.  Reviewed history of chronic sinusitis.  Recurrent use of antibiotics. CT sinus 2/2/221 Bilateral maxillary sinus mucosal thickening worse on the left, with opacification of the left maxillary sinus ostium and ethmoid infundibulum, bilateral maxillary sinus mucous retention cyst ; she previously followed with Dr Bradd Burner [facility] for chronic rhinitis; he suggested surgery 2021 which she declined.  We discussed duration of symptoms and how more likely to be viral in etiology based on duration.  In the past Levaquin and prednisone combination has worked very well for her.  She states doxycycline has not been helpful for her in the past. As she was on Levaquin in the past 4 months and based on her antibiotic allergies to PCN, we agreed  to trial azithromycin instead.  Provided her with a prescription for prednisone and encouraged her not to start antibiotic unless symptoms fail to resolve in the next couple of days. She declines repeat sinus CT or consult with ENT/allergy.

## 2022-08-27 NOTE — Progress Notes (Signed)
Assessment & Plan:  Acute recurrent frontal sinusitis Assessment & Plan: Afebrile.  Nontoxic in appearance.  Reviewed history of chronic sinusitis.  Recurrent use of antibiotics. CT sinus 2/2/221 Bilateral maxillary sinus mucosal thickening worse on the left, with opacification of the left maxillary sinus ostium and ethmoid infundibulum, bilateral maxillary sinus mucous retention cyst ; she previously followed with Dr Bradd Burner [facility] for chronic rhinitis; he suggested surgery 2021 which she declined.  We discussed duration of symptoms and how more likely to be viral in etiology based on duration.  In the past Levaquin and prednisone combination has worked very well for her.  She states doxycycline has not been helpful for her in the past. As she was on Levaquin in the past 4 months and based on her antibiotic allergies to PCN, we agreed  to trial azithromycin instead.  Provided her with a prescription for prednisone and encouraged her not to start antibiotic unless symptoms fail to resolve in the next couple of days. She declines repeat sinus CT or consult with ENT/allergy.    Orders: -     predniSONE; Take 4 tablets ( total 40 mg) by mouth for 2 days; take 3 tablets ( total 30 mg) by mouth for 2 days; take 2 tablets ( total 20 mg) by mouth for 1 day; take 1 tablet ( total 10 mg) by mouth for 1 day.  Dispense: 17 tablet; Refill: 0 -     Azithromycin; Take 2 tablets on day 1, then 1 tablet daily on days 2 through 5  Dispense: 6 tablet; Refill: 0     Return precautions given.   Risks, benefits, and alternatives of the medications and treatment plan prescribed today were discussed, and patient expressed understanding.   Education regarding symptom management and diagnosis given to patient on AVS either electronically or printed.  No follow-ups on file.  Stephanie Paris, FNP  Subjective:    Patient ID: Stephanie Boyd, female    DOB: [birth year], 47 y.o.   MRN: [id number]  CC: Stephanie Boyd  is a 47 y.o. female who presents today for an acute visit.    HPI: Complains of HA, facial pain, right ear pain x4 days, worsening.  Cough is occassional which has phelm. Nasal congestion can be thick and thin.   Started allegra D and sufafed, netti pot.   Symptoms started with sore throat.   In the past, she states Levaquin and prednisone together are most helpful for her.  Doxycycline has been ineffective.     Sinus infection 4 months ago, treated with Levaquin , prednisone No h/o ckd   Never smoker  CT sinus 2/2/221 Bilateral maxillary sinus mucosal thickening worse on the left, with opacification of the left maxillary sinus ostium and ethmoid infundibulum, bilateral maxillary sinus mucous retention cyst ; she previously followed with Dr Bradd Burner [facility] for chronic rhinitis; he suggested surgery 2021    Allergies: Hydrocodone-acetaminophen, Other, Penicillins, Sulfonamide derivatives, and Vancomycin Current Outpatient Medications on File Prior to Visit  Medication Sig Dispense Refill   ALPRAZolam (XANAX) 0.5 MG tablet Take 1 tablet (0.5 mg total) by mouth at bedtime as needed for sleep. 30 tablet 5   Butalbital-APAP-Caffeine 50-325-40 MG capsule Take 1-2 capsules by mouth every 6 (six) hours as needed for headache. 30 capsule 0   fluticasone (FLONASE) 50 MCG/ACT nasal spray Place 1 spray into both nostrils daily. 16 g 2   levofloxacin (LEVAQUIN) 500 MG tablet Take 1 tablet (500 mg total) by  mouth daily. 7 tablet 0   No current facility-administered medications on file prior to visit.    Review of Systems  Constitutional:  Negative for chills and fever.  HENT:  Positive for congestion, ear pain and sinus pain.   Respiratory:  Negative for cough.   Cardiovascular:  Negative for chest pain and palpitations.  Gastrointestinal:  Negative for nausea and vomiting.      Objective:    BP 128/84   Pulse 90   Temp 97.6 F (36.4 C) (Oral)   Ht 5\' 5"  (1.651 m)   Wt 179 lb 12.8  oz (81.6 kg)   LMP  (LMP Unknown)   SpO2 97%   BMI 29.92 kg/m   BP Readings from Last 3 Encounters:  08/27/22 128/84  05/04/22 112/64  08/17/21 108/64   Wt Readings from Last 3 Encounters:  08/27/22 179 lb 12.8 oz (81.6 kg)  05/04/22 180 lb 4 oz (81.8 kg)  08/17/21 185 lb 6.4 oz (84.1 kg)    Physical Exam Vitals reviewed.  Constitutional:      Appearance: She is well-developed.  HENT:     Head: Normocephalic and atraumatic.     Right Ear: Hearing, tympanic membrane, ear canal and external ear normal. No decreased hearing noted. No drainage, swelling or tenderness. No middle ear effusion. No foreign body. Tympanic membrane is not erythematous or bulging.     Left Ear: Hearing, tympanic membrane, ear canal and external ear normal. No decreased hearing noted. No drainage, swelling or tenderness.  No middle ear effusion. No foreign body. Tympanic membrane is not erythematous or bulging.     Nose: Nose normal. No rhinorrhea.     Right Sinus: No maxillary sinus tenderness or frontal sinus tenderness.     Left Sinus: No maxillary sinus tenderness or frontal sinus tenderness.     Mouth/Throat:     Pharynx: Uvula midline. No oropharyngeal exudate or posterior oropharyngeal erythema.     Tonsils: No tonsillar abscesses.  Eyes:     Conjunctiva/sclera: Conjunctivae normal.  Cardiovascular:     Rate and Rhythm: Regular rhythm.     Pulses: Normal pulses.     Heart sounds: Normal heart sounds.  Pulmonary:     Effort: Pulmonary effort is normal.     Breath sounds: Normal breath sounds. No wheezing, rhonchi or rales.  Lymphadenopathy:     Head:     Right side of head: No submental, submandibular, tonsillar, preauricular, posterior auricular or occipital adenopathy.     Left side of head: No submental, submandibular, tonsillar, preauricular, posterior auricular or occipital adenopathy.     Cervical: No cervical adenopathy.  Skin:    General: Skin is warm and dry.  Neurological:      Mental Status: She is alert.  Psychiatric:        Speech: Speech normal.        Behavior: Behavior normal.        Thought Content: Thought content normal.

## 2022-08-27 NOTE — Patient Instructions (Signed)
Start prednisone first.  Please ensure to get all doses then prior to 1 PM each day as can interfere with sleep.  If no improvement or resolution of symptoms the next couple days, at that point as we discussed you may start azithromycin.  Ensure to take probiotics while on antibiotics and also for 2 weeks after completion. This can either be by eating yogurt daily or taking a probiotic supplement over the counter such as Culturelle.It is important to re-colonize the gut with good bacteria and also to prevent any diarrheal infections associated with antibiotic use.

## 2022-08-29 ENCOUNTER — Other Ambulatory Visit: Payer: Self-pay | Admitting: Family

## 2022-08-29 ENCOUNTER — Encounter: Payer: Self-pay | Admitting: Family

## 2022-08-29 DIAGNOSIS — J0100 Acute maxillary sinusitis, unspecified: Secondary | ICD-10-CM

## 2022-08-29 MED ORDER — LEVOFLOXACIN 500 MG PO TABS: 500.0000 mg | ORAL_TABLET | Freq: Every day | ORAL | 0 refills | Status: AC

## 2022-09-26 ENCOUNTER — Other Ambulatory Visit: Payer: Self-pay | Admitting: Internal Medicine

## 2022-09-26 DIAGNOSIS — Z1231 Encounter for screening mammogram for malignant neoplasm of breast: Secondary | ICD-10-CM

## 2022-10-10 ENCOUNTER — Ambulatory Visit
Admission: RE | Admit: 2022-10-10 | Discharge: 2022-10-10 | Disposition: A | Discharge status: Home / Self Care | Payer: BC Managed Care – PPO | Source: Ambulatory Visit | Attending: Internal Medicine | Admitting: Internal Medicine

## 2022-10-10 DIAGNOSIS — Z1231 Encounter for screening mammogram for malignant neoplasm of breast: Secondary | ICD-10-CM | POA: Insufficient documentation

## 2022-11-19 ENCOUNTER — Telehealth: Payer: Self-pay

## 2022-11-19 NOTE — Telephone Encounter (Addendum)
Chart reviewed. Patient last seen 08/09/21 by Dr. Tiburcio Pea for annual. Dr. Tiburcio Pea is no longer with our practice. Notified pharmacy patient past due for annual. Advised to schedule appointment. She can also request this medication from her PCP.

## 2022-11-30 DIAGNOSIS — Z79890 Hormone replacement therapy (postmenopausal): Secondary | ICD-10-CM | POA: Diagnosis not present

## 2022-11-30 LAB — TESTOSTERONE: Testosterone: 15

## 2023-02-08 DIAGNOSIS — Z79890 Hormone replacement therapy (postmenopausal): Secondary | ICD-10-CM | POA: Diagnosis not present

## 2023-02-11 ENCOUNTER — Ambulatory Visit (INDEPENDENT_AMBULATORY_CARE_PROVIDER_SITE_OTHER): Payer: BC Managed Care – PPO | Admitting: Dermatology

## 2023-02-11 ENCOUNTER — Encounter: Payer: Self-pay | Admitting: Dermatology

## 2023-02-11 VITALS — BP 113/75 | HR 70

## 2023-02-11 DIAGNOSIS — W908XXA Exposure to other nonionizing radiation, initial encounter: Secondary | ICD-10-CM

## 2023-02-11 DIAGNOSIS — L821 Other seborrheic keratosis: Secondary | ICD-10-CM

## 2023-02-11 DIAGNOSIS — L578 Other skin changes due to chronic exposure to nonionizing radiation: Secondary | ICD-10-CM

## 2023-02-11 DIAGNOSIS — L818 Other specified disorders of pigmentation: Secondary | ICD-10-CM

## 2023-02-11 DIAGNOSIS — D229 Melanocytic nevi, unspecified: Secondary | ICD-10-CM

## 2023-02-11 DIAGNOSIS — D1801 Hemangioma of skin and subcutaneous tissue: Secondary | ICD-10-CM

## 2023-02-11 DIAGNOSIS — Z1283 Encounter for screening for malignant neoplasm of skin: Secondary | ICD-10-CM | POA: Diagnosis not present

## 2023-02-11 DIAGNOSIS — S50861A Insect bite (nonvenomous) of right forearm, initial encounter: Secondary | ICD-10-CM

## 2023-02-11 DIAGNOSIS — L814 Other melanin hyperpigmentation: Secondary | ICD-10-CM

## 2023-02-11 DIAGNOSIS — Z808 Family history of malignant neoplasm of other organs or systems: Secondary | ICD-10-CM

## 2023-02-11 DIAGNOSIS — W57XXXA Bitten or stung by nonvenomous insect and other nonvenomous arthropods, initial encounter: Secondary | ICD-10-CM

## 2023-02-11 DIAGNOSIS — D239 Other benign neoplasm of skin, unspecified: Secondary | ICD-10-CM

## 2023-02-11 DIAGNOSIS — D2371 Other benign neoplasm of skin of right lower limb, including hip: Secondary | ICD-10-CM

## 2023-02-11 NOTE — Patient Instructions (Addendum)
Recommend daily broad spectrum sunscreen SPF 30+ to sun-exposed areas, reapply every 2 hours as needed. Call for new or changing lesions.  Staying in the shade or wearing long sleeves, sun glasses (UVA+UVB protection) and wide brim hats (4-inch brim around the entire circumference of the hat) are also recommended for sun protection.    Seborrheic Keratosis  What causes seborrheic keratoses? Seborrheic keratoses are harmless, common skin growths that first appear during adult life.  As time goes by, more growths appear.  Some people may develop a large number of them.  Seborrheic keratoses appear on both covered and uncovered body parts.  They are not caused by sunlight.  The tendency to develop seborrheic keratoses can be inherited.  They vary in color from skin-colored to gray, brown, or even black.  They can be either smooth or have a rough, warty surface.   Seborrheic keratoses are superficial and look as if they were stuck on the skin.  Under the microscope this type of keratosis looks like layers upon layers of skin.  That is why at times the top layer may seem to fall off, but the rest of the growth remains and re-grows.    Treatment Seborrheic keratoses do not need to be treated, but can easily be removed in the office.  Seborrheic keratoses often cause symptoms when they rub on clothing or jewelry.  Lesions can be in the way of shaving.  If they become inflamed, they can cause itching, soreness, or burning.  Removal of a seborrheic keratosis can be accomplished by freezing, burning, or surgery. If any spot bleeds, scabs, or grows rapidly, please return to have it checked, as these can be an indication of a skin cancer.    Melanoma ABCDEs  Melanoma is the most dangerous type of skin cancer, and is the leading cause of death from skin disease.  You are more likely to develop melanoma if you: Have light-colored skin, light-colored eyes, or red or blond hair Spend a lot of time in the  sun Tan regularly, either outdoors or in a tanning bed Have had blistering sunburns, especially during childhood Have a close family member who has had a melanoma Have atypical moles or large birthmarks  Early detection of melanoma is key since treatment is typically straightforward and cure rates are extremely high if we catch it early.   The first sign of melanoma is often a change in a mole or a new dark spot.  The ABCDE system is a way of remembering the signs of melanoma.  A for asymmetry:  The two halves do not match. B for border:  The edges of the growth are irregular. C for color:  A mixture of colors are present instead of an even brown color. D for diameter:  Melanomas are usually (but not always) greater than 6mm - the size of a pencil eraser. E for evolution:  The spot keeps changing in size, shape, and color.  Please check your skin once per month between visits. You can use a small mirror in front and a large mirror behind you to keep an eye on the back side or your body.   If you see any new or changing lesions before your next follow-up, please call to schedule a visit.  Please continue daily skin protection including broad spectrum sunscreen SPF 30+ to sun-exposed areas, reapplying every 2 hours as needed when you're outdoors.   Staying in the shade or wearing long sleeves, sun glasses (UVA+UVB protection) and  wide brim hats (4-inch brim around the entire circumference of the hat) are also recommended for sun protection.    Due to recent changes in healthcare laws, you may see results of your pathology and/or laboratory studies on MyChart before the doctors have had a chance to review them. We understand that in some cases there may be results that are confusing or concerning to you. Please understand that not all results are received at the same time and often the doctors may need to interpret multiple results in order to provide you with the best plan of care or course of  treatment. Therefore, we ask that you please give [place] 2 business days to thoroughly review all your results before contacting the office for clarification. Should we see a critical lab result, you will be contacted sooner.   If You Need Anything After Your Visit  If you have any questions or concerns for your doctor, please call our main line at [phone] and press option 4 to reach your doctor's medical assistant. If no one answers, please leave a voicemail as directed and we will return your call as soon as possible. Messages left after 4 pm will be answered the following business day.   You may also send [place] a message via MyChart. We typically respond to MyChart messages within 1-2 business days.  For prescription refills, please ask your pharmacy to contact our office. Our fax number is [phone].  If you have an urgent issue when the clinic is closed that cannot wait until the next business day, you can page your doctor at the number below.    Please note that while we do our best to be available for urgent issues outside of office hours, we are not available 24/7.   If you have an urgent issue and are unable to reach [place], you may choose to seek medical care at your doctor's office, retail clinic, urgent care center, or emergency room.  If you have a medical emergency, please immediately call 911 or go to the emergency department.  Pager Numbers  - Dr. Gwen Pounds: [phone]  - Dr. Roseanne Reno: [phone]  - Dr. Katrinka Blazing: [phone]   In the event of inclement weather, please call our main line at [phone] for an update on the status of any delays or closures.  Dermatology Medication Tips: Please keep the boxes that topical medications come in in order to help keep track of the instructions about where and how to use these. Pharmacies typically print the medication instructions only on the boxes and not directly on the medication tubes.   If your medication is too expensive,  please contact our office at [phone] option 4 or send [place] a message through MyChart.   We are unable to tell what your co-pay for medications will be in advance as this is different depending on your insurance coverage. However, we may be able to find a substitute medication at lower cost or fill out paperwork to get insurance to cover a needed medication.   If a prior authorization is required to get your medication covered by your insurance company, please allow [place] 1-2 business days to complete this process.  Drug prices often vary depending on where the prescription is filled and some pharmacies may offer cheaper prices.  The website www.goodrx.com contains coupons for medications through different pharmacies. The prices here do not account for what the cost may be with help from insurance (it may be cheaper with your insurance), but the website can  give you the price if you did not use any insurance.  - You can print the associated coupon and take it with your prescription to the pharmacy.  - You may also stop by our office during regular business hours and pick up a GoodRx coupon card.  - If you need your prescription sent electronically to a different pharmacy, notify our office through [facility] or by phone at [phone] option 4.     Si Usted Necesita Algo Despus de Su Visita  Tambin puede enviarnos un mensaje a travs de Clinical cytogeneticist. Por lo general respondemos a los mensajes de MyChart en el transcurso de 1 a 2 das hbiles.  Para renovar recetas, por favor pida a su farmacia que se ponga en contacto con nuestra oficina. Annie Sable de fax es [place] [phone].  Si tiene un asunto urgente cuando la clnica est cerrada y que no puede esperar hasta el siguiente da hbil, puede llamar/localizar a su doctor(a) al nmero que aparece a continuacin.   Por favor, tenga en cuenta que aunque hacemos todo lo posible para estar disponibles para asuntos urgentes fuera del  horario de [place], no estamos disponibles las 24 horas del da, los 7 [address]  [address] de la [place].   Si tiene un problema urgente y no puede comunicarse con nosotros, puede optar por buscar atencin mdica  en el consultorio de su doctor(a), en una clnica privada, en un centro de atencin urgente o en una sala de emergencias.  Si tiene Engineer, drilling, por favor llame inmediatamente al 911 o vaya a la sala de emergencias.  Nmeros de bper  - Dr. Gwen Pounds: [phone]  - Dra. Roseanne Reno: [phone]  - Dr. Katrinka Blazing: [phone]   En caso de inclemencias del tiempo, por favor llame a Lacy Duverney principal al [phone] para una actualizacin sobre el [place] de cualquier retraso o cierre.  Consejos para la medicacin en dermatologa: Por favor, guarde las cajas en las que vienen los medicamentos de uso tpico para ayudarle a seguir las instrucciones sobre dnde y cmo usarlos. Las farmacias generalmente imprimen las instrucciones del medicamento slo en las cajas y no directamente en los tubos del [place].   Si su medicamento es muy caro, por favor, pngase en contacto con Rolm Gala llamando al [phone] y presione la opcin 4 o envenos un mensaje a travs de Clinical cytogeneticist.   No podemos decirle cul ser su copago por los medicamentos por adelantado ya que esto es diferente dependiendo de la cobertura de su seguro. Sin embargo, es posible que podamos encontrar un medicamento sustituto a Audiological scientist un formulario para que el seguro cubra el medicamento que se considera necesario.   Si se requiere una autorizacin previa para que su compaa de seguros [place] su medicamento, por favor permtanos de 1 a 2 das hbiles para completar [address]  Los precios de los medicamentos varan con frecuencia dependiendo del Environmental consultant de dnde se surte la receta y alguna farmacias pueden ofrecer precios ms baratos.  El sitio web www.goodrx.com tiene cupones para medicamentos de Engineer, civil (consulting). Los precios aqu no tienen en cuenta lo que podra costar con la ayuda del seguro (puede ser ms barato con su seguro), pero el sitio web puede darle el precio si no utiliz Tourist information centre manager.  - Puede imprimir el cupn correspondiente y llevarlo con su receta a la farmacia.  - Tambin puede pasar por nuestra oficina durante el horario de atencin regular y Education officer, museum una tarjeta de cupones de GoodRx.  -  Si necesita que su receta se enve electrnicamente a Psychiatrist, informe a nuestra oficina a travs de MyChart de [place] o por telfono llamando al [phone] y presione la opcin 4.

## 2023-02-11 NOTE — Progress Notes (Signed)
New Patient Visit   Subjective  Stephanie Boyd is a 47 y.o. female who presents for the following: Skin Cancer Screening and Full Body Skin Exam. No personal hx of skin cancer or dysplastic nevi. Father had MM on arm and right lung. Stage IV  The patient presents for Total-Body Skin Exam (TBSE) for skin cancer screening and mole check. The patient has spots, moles and lesions to be evaluated, some may be new or changing and the patient may have concern these could be cancer.    The following portions of the chart were reviewed this encounter and updated as appropriate: medications, allergies, medical history  Review of Systems:  No other skin or systemic complaints except as noted in HPI or Assessment and Plan.  Objective  Well appearing patient in no apparent distress; mood and affect are within normal limits.  A full examination was performed including scalp, head, eyes, ears, nose, lips, neck, chest, axillae, abdomen, back, buttocks, bilateral upper extremities, bilateral lower extremities, hands, feet, fingers, toes, fingernails, and toenails. All findings within normal limits unless otherwise noted below.   Relevant physical exam findings are noted in the Assessment and Plan.    Assessment & Plan   FAMILY HISTORY OF SKIN CANCER What type(s): MM Who affected: Father    SKIN CANCER SCREENING PERFORMED TODAY.  ACTINIC DAMAGE - Chronic condition, secondary to cumulative UV/sun exposure - diffuse scaly erythematous macules with underlying dyspigmentation - Recommend daily broad spectrum sunscreen SPF 30+ to sun-exposed areas, reapply every 2 hours as needed.  - Staying in the shade or wearing long sleeves, sun glasses (UVA+UVB protection) and wide brim hats (4-inch brim around the entire circumference of the hat) are also recommended for sun protection.  - Call for new or changing lesions.  LENTIGINES, SEBORRHEIC KERATOSES, HEMANGIOMAS - Benign normal skin lesions -  Benign-appearing - Call for any changes  MELANOCYTIC NEVI - Tan-brown and/or pink-flesh-colored symmetric macules and papules - Benign appearing on exam today - Observation - Call clinic for new or changing moles - Recommend daily use of broad spectrum spf 30+ sunscreen to sun-exposed areas.    SEBORRHEIC KERATOSIS - Stuck-on, waxy, tan-brown papule at right cheek - Benign-appearing - Discussed benign etiology and prognosis. - Observe - Call for any changes   Insect Bites  Exam: erythematous edematous papules at right forearm  Treatment: Discussed prescribing topical corticosteroid. Patient deferred treatment at this time.  Multiple benign nevi  Lentigines  Actinic elastosis  Seborrheic keratoses  Cherry angioma  Idiopathic guttate hypomelanosis  Dermatofibroma  DERMATOFIBROMA Exam: Firm pink/brown papulenodule with dimple sign at right lateral lower leg. Treatment Plan: A dermatofibroma is a benign growth possibly related to trauma, such as an insect bite, cut from shaving, or inflamed acne-type bump.  Treatment options to remove include shave or excision with resulting scar and risk of recurrence.  Since benign-appearing and not bothersome, will observe for now.   Idiopathic Guttate Hypomelanosis (IGH)  Exam: scattered small hypopigmented macules  IGH is a benign chronic condition of sun-exposed skin, most commonly affecting the arms and legs. Cause is unknown but it can be a genetic trait. It is not related to vitiligo. There is no treatment. Recommend photoprotection and regular use of spf 30 or higher sunscreen which may help prevent the development of more white spots.  Treatment Plan:  Benign, observe.    Return in about 1 year (around 02/11/2024) for TBSE, FmHxMM.  I, Lawson Radar, CMA, am acting as scribe for  Elie Goody, MD.   Documentation: I have reviewed the above documentation for accuracy and completeness, and I agree with the  above.  Elie Goody, MD

## 2023-03-06 ENCOUNTER — Encounter: Payer: Self-pay | Admitting: Internal Medicine

## 2023-03-06 ENCOUNTER — Ambulatory Visit: Payer: BC Managed Care – PPO | Admitting: Internal Medicine

## 2023-03-06 ENCOUNTER — Other Ambulatory Visit (HOSPITAL_COMMUNITY)
Admission: RE | Admit: 2023-03-06 | Discharge: 2023-03-06 | Disposition: A | Discharge status: Home / Self Care | Payer: BC Managed Care – PPO | Source: Ambulatory Visit | Attending: Internal Medicine | Admitting: Internal Medicine

## 2023-03-06 VITALS — BP 100/68 | HR 78 | Ht 65.0 in | Wt 186.4 lb

## 2023-03-06 DIAGNOSIS — Z124 Encounter for screening for malignant neoplasm of cervix: Secondary | ICD-10-CM

## 2023-03-06 DIAGNOSIS — Z79899 Other long term (current) drug therapy: Secondary | ICD-10-CM | POA: Diagnosis not present

## 2023-03-06 DIAGNOSIS — Z0000 Encounter for general adult medical examination without abnormal findings: Secondary | ICD-10-CM

## 2023-03-06 DIAGNOSIS — Z0001 Encounter for general adult medical examination with abnormal findings: Secondary | ICD-10-CM

## 2023-03-06 DIAGNOSIS — J0111 Acute recurrent frontal sinusitis: Secondary | ICD-10-CM | POA: Diagnosis not present

## 2023-03-06 DIAGNOSIS — N951 Menopausal and female climacteric states: Secondary | ICD-10-CM | POA: Diagnosis not present

## 2023-03-06 DIAGNOSIS — J320 Chronic maxillary sinusitis: Secondary | ICD-10-CM | POA: Diagnosis not present

## 2023-03-06 DIAGNOSIS — E663 Overweight: Secondary | ICD-10-CM

## 2023-03-06 DIAGNOSIS — K9509 Other complications of gastric band procedure: Secondary | ICD-10-CM

## 2023-03-06 MED ORDER — BUTALBITAL-APAP-CAFFEINE 50-325-40 MG PO CAPS: 1.0000 | ORAL_CAPSULE | Freq: Four times a day (QID) | ORAL | 0 refills | Status: DC | PRN

## 2023-03-06 NOTE — Patient Instructions (Signed)
I will continue to refill the hormone replacements ,  we will need to monitor your blood work every 3 months for the first year  but you do not need to see me more than every 6 months  If you develop any more spotting, please let me know so I can refer you to GYN for an endometrial evaluation

## 2023-03-06 NOTE — Progress Notes (Addendum)
Patient ID: Stephanie Boyd, female    DOB: March 21, 1976  Age: 47 y.o. MRN: [id number]  The patient is here for annual preventive examination and management of other chronic and acute problems.   The risk factors are reflected in the social history.   The roster of all physicians providing medical care to patient - is listed in the Snapshot section of the chart.   Activities of daily living:  The patient is 100% independent in all ADLs: dressing, toileting, feeding as well as independent mobility   Home safety : The patient has smoke detectors in the home. They wear seatbelts.  There are no unsecured firearms at home. There is no violence in the home.    There is no risks for hepatitis, STDs or HIV. There is no   history of blood transfusion. They have no travel history to infectious disease endemic areas of the world.   The patient has seen their dentist in the last six month. They have seen their eye doctor in the last year. The patinet  denies slight hearing difficulty with regard to whispered voices and some television programs.  They have deferred audiologic testing in the last year.  They do not  have excessive sun exposure. Discussed the need for sun protection: hats, long sleeves and use of sunscreen if there is significant sun exposure.    Diet: the importance of a healthy diet is discussed. They do have a healthy diet.   The benefits of regular aerobic exercise were discussed. The patient  exercises  3 to 5 days per week  for  60 minutes.    Depression screen: there are no signs or vegative symptoms of depression- irritability, change in appetite, anhedonia, sadness/tearfullness.   The following portions of the patient's history were reviewed and updated as appropriate: allergies, current medications, past family history, past medical history,  past surgical history, past social history  and problem list.   Visual acuity was not assessed per patient preference since the patient has  regular follow up with an  ophthalmologist. Hearing and body mass index were assessed and reviewed.    During the course of the visit the patient was educated and counseled about appropriate screening and preventive services including : fall prevention , diabetes screening, nutrition counseling, colorectal cancer screening, and recommended immunizations.    Chief Complaint:   Menopause:  Seeing a functional practitioner in  [place] [name], NP for management of menopause .  Mammogram was done ,  labs were done hormone levels were very very low in March Since April has been taking testosterone 1 gram daily via cream estrogen  patch  0.05 mg/day twice weekly and  300 mcg progesterone .  Compounded pharmacy in [place]  does the testosterone .   She feels great,  has more energy exercises  regularly,  sleeping better but worrying about 2 sons who are far away (marine in training )  one in [place] in boarding school .  NEEDS TO HAVE ME TAKE OVER THE MEDS    SOME SPOTTING IN AUGUST BUT OTHERWISE  HAS BEEN AMENORRHEIC SINCE  OCT 2023  CHRONIC FRONTAL HEADACHE.  SINUS PROBLEMS,  HAS DEFERRED SURGERY  . DOES NOT SLEEP FLAT. Marland Kitchen  NO RHINITIS OR SINUS CONGESTIONS.  USES BUTALBITAL  PRN   Review of Symptoms  Patient denies fevers, malaise, unintentional weight loss, skin rash, eye pain, , sore throat, dysphagia,  hemoptysis , cough, dyspnea, wheezing, chest pain, palpitations, orthopnea, edema, abdominal pain,  nausea, melena, diarrhea, constipation, flank pain, dysuria, hematuria, urinary  Frequency, nocturia, numbness, tingling, seizures,  Focal weakness, Loss of consciousness,  Tremor, insomnia, depression, anxiety, and suicidal ideation.    Physical Exam:  BP 100/68   Pulse 78   Ht 5\' 5"  (1.651 m)   Wt 186 lb 6.4 oz (84.6 kg)   SpO2 97%   BMI 31.02 kg/m    Physical Exam Vitals reviewed.  Constitutional:      General: She is not in acute distress.    Appearance: Normal appearance. She  is normal weight. She is not ill-appearing, toxic-appearing or diaphoretic.  HENT:     Head: Normocephalic.  Eyes:     General: No scleral icterus.       Right eye: No discharge.        Left eye: No discharge.     Conjunctiva/sclera: Conjunctivae normal.  Cardiovascular:     Rate and Rhythm: Normal rate and regular rhythm.     Heart sounds: Normal heart sounds.  Pulmonary:     Effort: Pulmonary effort is normal. No respiratory distress.     Breath sounds: Normal breath sounds.  Chest:  Breasts:    Breasts are symmetrical.     Right: Normal.     Left: Normal.  Abdominal:     Hernia: There is no hernia in the left inguinal area or right inguinal area.  Genitourinary:    Exam position: Lithotomy position.     Pubic Area: No rash or pubic lice.      Labia:        Right: No rash, tenderness, lesion or injury.        Left: No rash, tenderness, lesion or injury.      Vagina: Normal.     Cervix: Normal.     Uterus: Normal.      Adnexa: Right adnexa normal and left adnexa normal.  Musculoskeletal:        General: Normal range of motion.  Lymphadenopathy:     Upper Body:     Right upper body: No supraclavicular, axillary or pectoral adenopathy.     Left upper body: No supraclavicular, axillary or pectoral adenopathy.     Lower Body: No right inguinal adenopathy. No left inguinal adenopathy.  Skin:    General: Skin is warm and dry.  Neurological:     General: No focal deficit present.     Mental Status: She is alert and oriented to person, place, and time. Mental status is at baseline.  Psychiatric:        Mood and Affect: Mood normal.        Behavior: Behavior normal.        Thought Content: Thought content normal.        Judgment: Judgment normal.    Assessment and Plan: Encounter for general adult medical examination with abnormal findings Assessment & Plan: age appropriate education and counseling updated, referrals for preventative services and immunizations addressed,  dietary and smoking counseling addressed, most recent labs reviewed.  I have personally reviewed and have noted:   1) the patient's medical and social history 2) The pt's use of alcohol, tobacco, and illicit drugs 3) The patient's current medications and supplements 4) Functional ability including ADL's, fall risk, home safety risk, hearing and visual impairment 5) Diet and physical activities 6) Evidence for depression or mood disorder 7) The patient's height, weight, and BMI have been recorded in the chart   I have made referrals, and provided counseling and  education based on review of the above    Cervical cancer screening -     Cytology - PAP  Long-term use of high-risk medication Assessment & Plan: She will need to have quartelry CBC.  CMEt and lipids while using testosterone,  and any spotting will need evaluation for enodmetrial hyperplasia   Orders: -     Lipid Panel w/reflex Direct LDL -     CBC with Differential/Platelet -     Comprehensive metabolic panel  Chronic maxillary sinusitis Assessment & Plan: She has recurrent episodes of sinusitis every 3-4 months ; she has been offered surgery but declines     Acute recurrent frontal sinusitis Assessment & Plan: Treated with Levaquin in March by MA after failing to improve with azithromycin    Overweight (BMI 25.0-29.9) Assessment & Plan: She is s/p lap band removal and  revision of gastric bypass , done at [facility] Oct 2019. Body mass index is 31.02 kg/m. She is losing weight with GLP 1 agonist.     Menopause syndrome Assessment & Plan: Managed with estrogen progesterone and testosterone  prescribed by function medicine NP .  The testosterone is compounded by local pharmacy    Gastric band slippage  Other orders -     Butalbital-APAP-Caffeine; Take 1-2 capsules by mouth every 6 (six) hours as needed for headache.  Dispense: 120 capsule; Refill: 0    No follow-ups on file.  Sherlene Shams, MD

## 2023-03-07 DIAGNOSIS — Z79899 Other long term (current) drug therapy: Secondary | ICD-10-CM | POA: Insufficient documentation

## 2023-03-07 DIAGNOSIS — N951 Menopausal and female climacteric states: Secondary | ICD-10-CM | POA: Insufficient documentation

## 2023-03-07 LAB — CBC WITH DIFFERENTIAL/PLATELET
Basophils Absolute: 0.1 10*3/uL (ref 0.0–0.1)
Basophils Relative: 1 % (ref 0.0–3.0)
Eosinophils Absolute: 0.1 10*3/uL (ref 0.0–0.7)
Eosinophils Relative: 1.3 % (ref 0.0–5.0)
HCT: 40.8 % (ref 36.0–46.0)
Hemoglobin: 13.2 g/dL (ref 12.0–15.0)
Lymphocytes Relative: 29.7 % (ref 12.0–46.0)
Lymphs Abs: 2.3 10*3/uL (ref 0.7–4.0)
MCHC: 32.3 g/dL (ref 30.0–36.0)
MCV: 90 fL (ref 78.0–100.0)
Monocytes Absolute: 0.4 10*3/uL (ref 0.1–1.0)
Monocytes Relative: 5.4 % (ref 3.0–12.0)
Neutro Abs: 4.9 10*3/uL (ref 1.4–7.7)
Neutrophils Relative %: 62.6 % (ref 43.0–77.0)
Platelets: 389 10*3/uL (ref 150.0–400.0)
RBC: 4.54 Mil/uL (ref 3.87–5.11)
RDW: 14.1 % (ref 11.5–15.5)
WBC: 7.8 10*3/uL (ref 4.0–10.5)

## 2023-03-07 LAB — COMPREHENSIVE METABOLIC PANEL
ALT: 16 U/L (ref 0–35)
AST: 16 U/L (ref 0–37)
Albumin: 4.3 g/dL (ref 3.5–5.2)
Alkaline Phosphatase: 62 U/L (ref 39–117)
BUN: 12 mg/dL (ref 6–23)
CO2: 26 meq/L (ref 19–32)
Calcium: 9 mg/dL (ref 8.4–10.5)
Chloride: 103 meq/L (ref 96–112)
Creatinine, Ser: 0.65 mg/dL (ref 0.40–1.20)
GFR: 103.58 mL/min (ref >60.00)
Glucose, Bld: 91 mg/dL (ref 70–99)
Potassium: 4.3 meq/L (ref 3.5–5.1)
Sodium: 136 meq/L (ref 135–145)
Total Bilirubin: 0.3 mg/dL (ref 0.2–1.2)
Total Protein: 6.7 g/dL (ref 6.0–8.3)

## 2023-03-07 LAB — LIPID PANEL W/REFLEX DIRECT LDL
Cholesterol: 153 mg/dL (ref <200)
HDL: 47 mg/dL — ABNORMAL LOW (ref >=50)
LDL Cholesterol (Calc): 82 mg/dL
Non-HDL Cholesterol (Calc): 106 mg/dL (ref <130)
Total CHOL/HDL Ratio: 3.3 (calc) (ref <5.0)
Triglycerides: 140 mg/dL (ref <150)

## 2023-03-07 NOTE — Assessment & Plan Note (Signed)
Managed with estrogen progesterone and testosterone  prescribed by function medicine NP .  The testosterone is compounded by local pharmacy

## 2023-03-07 NOTE — Assessment & Plan Note (Addendum)
Treated with Levaquin in March by MA after failing to improve with azithromycin

## 2023-03-07 NOTE — Assessment & Plan Note (Signed)

## 2023-03-07 NOTE — Assessment & Plan Note (Addendum)
She has recurrent episodes of sinusitis every 3-4 months ; she has been offered surgery but declines

## 2023-03-07 NOTE — Assessment & Plan Note (Signed)
She will need to have quartelry CBC.  CMEt and lipids while using testosterone,  and any spotting will need evaluation for enodmetrial hyperplasia

## 2023-03-07 NOTE — Assessment & Plan Note (Signed)
She is s/p lap band removal and  revision of gastric bypass , done at [facility] Oct 2019. Body mass index is 31.02 kg/m. She is losing weight with GLP 1 agonist.

## 2023-03-09 ENCOUNTER — Other Ambulatory Visit: Payer: Self-pay | Admitting: Internal Medicine

## 2023-03-11 LAB — CYTOLOGY - PAP
Adequacy: ABSENT
Comment: NEGATIVE
Diagnosis: NEGATIVE
High risk HPV: NEGATIVE

## 2023-03-12 ENCOUNTER — Encounter: Payer: Self-pay | Admitting: Internal Medicine

## 2023-03-14 ENCOUNTER — Encounter: Payer: Self-pay | Admitting: Pharmacist

## 2023-03-14 ENCOUNTER — Other Ambulatory Visit: Payer: Self-pay

## 2023-03-14 MED ORDER — PROGESTERONE MICRONIZED 100 MG PO CAPS
100.0000 mg | ORAL_CAPSULE | Freq: Every day | ORAL | 11 refills | Status: DC
  Filled 2023-03-14 (×2): qty 20, 20d supply, fill #0

## 2023-03-14 MED ORDER — PROGESTERONE 200 MG PO CAPS
200.0000 mg | ORAL_CAPSULE | Freq: Every day | ORAL | 11 refills | Status: DC
  Filled 2023-03-14: qty 20, 20d supply, fill #0

## 2023-03-14 MED ORDER — ESTRADIOL 0.05 MG/24HR TD PTTW
1.0000 | MEDICATED_PATCH | TRANSDERMAL | 11 refills | Status: DC
  Filled 2023-03-14: qty 8, 28d supply, fill #0

## 2023-03-15 ENCOUNTER — Other Ambulatory Visit: Payer: Self-pay

## 2023-04-08 ENCOUNTER — Encounter: Payer: Self-pay | Admitting: Internal Medicine

## 2023-04-08 NOTE — Telephone Encounter (Signed)
Can we write this on the rx pad and fax to the pharmacy. If okay I will get you a rx pad.

## 2023-04-10 NOTE — Telephone Encounter (Signed)
faxed

## 2023-04-22 DIAGNOSIS — N939 Abnormal uterine and vaginal bleeding, unspecified: Secondary | ICD-10-CM | POA: Diagnosis not present

## 2023-04-22 DIAGNOSIS — Z79890 Hormone replacement therapy (postmenopausal): Secondary | ICD-10-CM | POA: Diagnosis not present

## 2023-04-22 DIAGNOSIS — N913 Primary oligomenorrhea: Secondary | ICD-10-CM | POA: Diagnosis not present

## 2023-04-24 ENCOUNTER — Other Ambulatory Visit: Payer: Self-pay | Admitting: Obstetrics & Gynecology

## 2023-04-24 DIAGNOSIS — N939 Abnormal uterine and vaginal bleeding, unspecified: Secondary | ICD-10-CM

## 2023-04-26 ENCOUNTER — Ambulatory Visit: Payer: BC Managed Care – PPO

## 2023-05-21 ENCOUNTER — Encounter: Payer: Self-pay | Admitting: Family Medicine

## 2023-05-21 ENCOUNTER — Ambulatory Visit: Payer: BC Managed Care – PPO | Admitting: Family Medicine

## 2023-05-21 VITALS — BP 128/80 | HR 78 | Temp 98.2°F | Ht 65.0 in | Wt 184.4 lb

## 2023-05-21 DIAGNOSIS — J320 Chronic maxillary sinusitis: Secondary | ICD-10-CM

## 2023-05-21 DIAGNOSIS — Z8669 Personal history of other diseases of the nervous system and sense organs: Secondary | ICD-10-CM | POA: Diagnosis not present

## 2023-05-21 MED ORDER — PREDNISONE 20 MG PO TABS
20.0000 mg | ORAL_TABLET | Freq: Every day | ORAL | 0 refills | Status: AC
Start: 2023-05-21 — End: 2023-05-26

## 2023-05-21 MED ORDER — LEVOFLOXACIN 500 MG PO TABS
500.0000 mg | ORAL_TABLET | Freq: Every day | ORAL | 0 refills | Status: AC
Start: 2023-05-21 — End: 2023-05-28

## 2023-05-21 NOTE — Patient Instructions (Addendum)
It was a pleasure meeting you today. Thank you for allowing me to take part in your health care.  Our goals for today as we discussed include:  Start Prednisone 20 mg daily for 5 days Start Levaquin 500 mg daily for 7 days Start Probiotics daily and continue for 14 days after treatment  Consider Magnesium 500 mg daily for headaches  This is a list of the screening recommended for you and due dates:  Health Maintenance  Topic Date Due   COVID-19 Vaccine (1) Never done   HIV Screening  Never done   Colon Cancer Screening  Never done   Flu Shot  09/02/2023*   DTaP/Tdap/Td vaccine (2 - Td or Tdap) 09/23/2024   Pap with HPV screening  03/05/2028   Hepatitis C Screening  Completed   HPV Vaccine  Aged Out  *Topic was postponed. The date shown is not the original due date.     Follow up if worsening symptoms  If you have any questions or concerns, please do not hesitate to call the office at [phone].  I look forward to our next visit and until then take care and stay safe.  Regards,   Dana Allan, MD   [facility]

## 2023-05-21 NOTE — Progress Notes (Signed)
SUBJECTIVE:   Chief Complaint  Patient presents with   Headache    C/o headache and ear pain.   HPI Presents to clinic for acute visit  Discussed the use of AI scribe software for clinical note transcription with the patient, who gave verbal consent to proceed.  History of Present Illness The patient, with a history of chronic sinusitis, presents with a severe headache that began on a Saturday night. The headache was so intense that it induced vomiting and persisted through Sunday and Monday. Despite attempts to alleviate the symptoms with neti pots and steamers, the patient's sinuses would not drain. However, in the last three hours prior to the consultation, the sinuses began to drain, and the headache started to subside. The patient also stopped vomiting, which had been occurring due to the severe pain. This pattern of severe headaches and sinus blockage occurs two to three times a year.  The headache is localized deep on one side of the head, and the right ear is also painful. The patient reports light sensitivity but denies any fever. The patient has a history of chronic sinusitis, diagnosed in 2010, and has seen multiple ENT specialists. The patient has struggled with prevention measures to improve sinus drainage and prevent these episodes.  The patient also has a history of migraines, which have decreased in frequency to a few times a year. The patient has tried various treatments, including glasses, dietary changes, and daily medications. The patient also experiences auras, which occurred on the same day as the onset of the recent headache. However, the headache did not follow the aura as usual. The patient also started their menstrual period on the same day as the aura, which they initially thought was the cause of the headache. The patient denies any weakness, numbness, tingling, or dizziness.  The patient has had three weight loss surgeries and lost a significant amount of weight  between 2010 and 2015. The patient denies any recent weight loss. The patient has tried various medications for the sinusitis and migraines, including butalbital, which did not alleviate the recent headache. The patient has also tried prophylactic treatments like Topamax and has taken estrogen. The patient has been treated with Levaquin and prednisone for the sinusitis in the past.    PERTINENT PMH / PSH: As above  OBJECTIVE:  BP 128/80   Pulse 78   Temp 98.2 F (36.8 C) (Oral)   Ht 5\' 5"  (1.651 m)   Wt 184 lb 6.4 oz (83.6 kg)   SpO2 97%   BMI 30.69 kg/m    Physical Exam Vitals reviewed.  Constitutional:      General: She is not in acute distress.    Appearance: Normal appearance. She is obese. She is not ill-appearing, toxic-appearing or diaphoretic.  HENT:     Nose:     Right Turbinates: Swollen and pale.     Left Turbinates: Swollen and pale.     Right Sinus: Maxillary sinus tenderness and frontal sinus tenderness present.     Left Sinus: Maxillary sinus tenderness and frontal sinus tenderness present.  Eyes:     General:        Right eye: No discharge.        Left eye: No discharge.     Conjunctiva/sclera: Conjunctivae normal.  Cardiovascular:     Rate and Rhythm: Normal rate and regular rhythm.     Heart sounds: Normal heart sounds.  Pulmonary:     Effort: Pulmonary effort is normal.  Breath sounds: Normal breath sounds.  Abdominal:     General: Bowel sounds are normal.  Musculoskeletal:        General: Normal range of motion.  Skin:    General: Skin is warm and dry.  Neurological:     General: No focal deficit present.     Mental Status: She is alert and oriented to person, place, and time. Mental status is at baseline.  Psychiatric:        Mood and Affect: Mood normal.        Behavior: Behavior normal.        Thought Content: Thought content normal.        Judgment: Judgment normal.        08/27/2022   11:05 AM 08/17/2021    9:55 AM 07/27/2020     4:19 PM 12/14/2019    3:11 PM 09/10/2016   10:19 AM  Depression screen PHQ 2/9  Decreased Interest 0 1 1 0 0  Down, Depressed, Hopeless 0 1 2 0 0  PHQ - 2 Score 0 2 3 0 0  Altered sleeping  2 2 1    Tired, decreased energy  1 3 1    Change in appetite  0 0 0   Feeling bad or failure about yourself   1 1 1    Trouble concentrating  0 1 0   Moving slowly or fidgety/restless  0 0 0   Suicidal thoughts  0 0 0   PHQ-9 Score  6 10 3    Difficult doing work/chores  Not difficult at all Somewhat difficult Not difficult at all        No data to display          ASSESSMENT/PLAN:  Chronic maxillary sinusitis Assessment & Plan: Severe headache with vomiting, photophobia, and sinus pressure. History of recurrent sinusitis. Sinuses are starting to drain and headache is improving. No fever. Last MRI of the head was a few years ago. -Start Levaquin for 7 days and Prednisone taper as per previous successful treatment.  Orders: -     predniSONE; Take 1 tablet (20 mg total) by mouth daily with breakfast for 5 days.  Dispense: 5 tablet; Refill: 0 -     levoFLOXacin; Take 1 tablet (500 mg total) by mouth daily for 7 days.  Dispense: 7 tablet; Refill: 0  History of migraine Assessment & Plan: History of migraines with aura. Frequency has decreased significantly over the years. No current prophylactic or abortive therapy. -Consider restarting prophylactic therapy if frequency of migraines increases.    Follow-up If worsening headaches, weakness, numbness, tingling, or increased nausea/vomiting, order an MRI of the head and follow-up in the office.    PDMP reviewed  Return if symptoms worsen or fail to improve, for PCP.  Dana Allan, MD

## 2023-05-28 DIAGNOSIS — Z8669 Personal history of other diseases of the nervous system and sense organs: Secondary | ICD-10-CM | POA: Insufficient documentation

## 2023-05-28 NOTE — Assessment & Plan Note (Signed)
History of migraines with aura. Frequency has decreased significantly over the years. No current prophylactic or abortive therapy. -Consider restarting prophylactic therapy if frequency of migraines increases.

## 2023-05-28 NOTE — Assessment & Plan Note (Signed)
Severe headache with vomiting, photophobia, and sinus pressure. History of recurrent sinusitis. Sinuses are starting to drain and headache is improving. No fever. Last MRI of the head was a few years ago. -Start Levaquin for 7 days and Prednisone taper as per previous successful treatment.

## 2023-06-14 ENCOUNTER — Ambulatory Visit: Payer: BC Managed Care – PPO

## 2023-06-19 ENCOUNTER — Encounter: Payer: Self-pay | Admitting: Internal Medicine

## 2023-06-20 ENCOUNTER — Other Ambulatory Visit: Payer: Self-pay | Admitting: Internal Medicine

## 2023-06-20 MED ORDER — TESTOSTERONE MICRONIZED CRYS: CRYSTALS | 5 refills | Status: DC

## 2023-07-01 ENCOUNTER — Encounter: Admitting: Internal Medicine

## 2023-10-14 ENCOUNTER — Other Ambulatory Visit: Payer: Self-pay | Admitting: Internal Medicine

## 2023-10-17 ENCOUNTER — Encounter: Payer: Self-pay | Admitting: Internal Medicine

## 2023-10-17 NOTE — Telephone Encounter (Signed)
 This rx can not be sent through epic I have placed the paper rx for signature in your quick sign folder.

## 2023-10-22 ENCOUNTER — Other Ambulatory Visit: Payer: Self-pay

## 2023-10-22 MED ORDER — PROGESTERONE MICRONIZED 100 MG PO CAPS: 100.0000 mg | ORAL_CAPSULE | Freq: Every day | ORAL | 1 refills | Status: DC

## 2023-10-22 MED ORDER — PROGESTERONE 200 MG PO CAPS: 200.0000 mg | ORAL_CAPSULE | Freq: Every day | ORAL | 1 refills | Status: DC

## 2023-12-27 IMAGING — MG MM DIGITAL SCREENING BILAT W/ TOMO AND CAD
6 of 12 series · 6 of 36 positions shown · non-contrast
Comparison: Previous exam(s).

CLINICAL DATA: Screening.

EXAM:
DIGITAL SCREENING BILATERAL MAMMOGRAM WITH TOMOSYNTHESIS AND CAD
TECHNIQUE: Bilateral screening digital craniocaudal and mediolateral oblique
mammograms were obtained. Bilateral screening digital breast
tomosynthesis was performed. The images were evaluated with
computer-aided detection.

[R MLO synth-2D (1 of 2)]
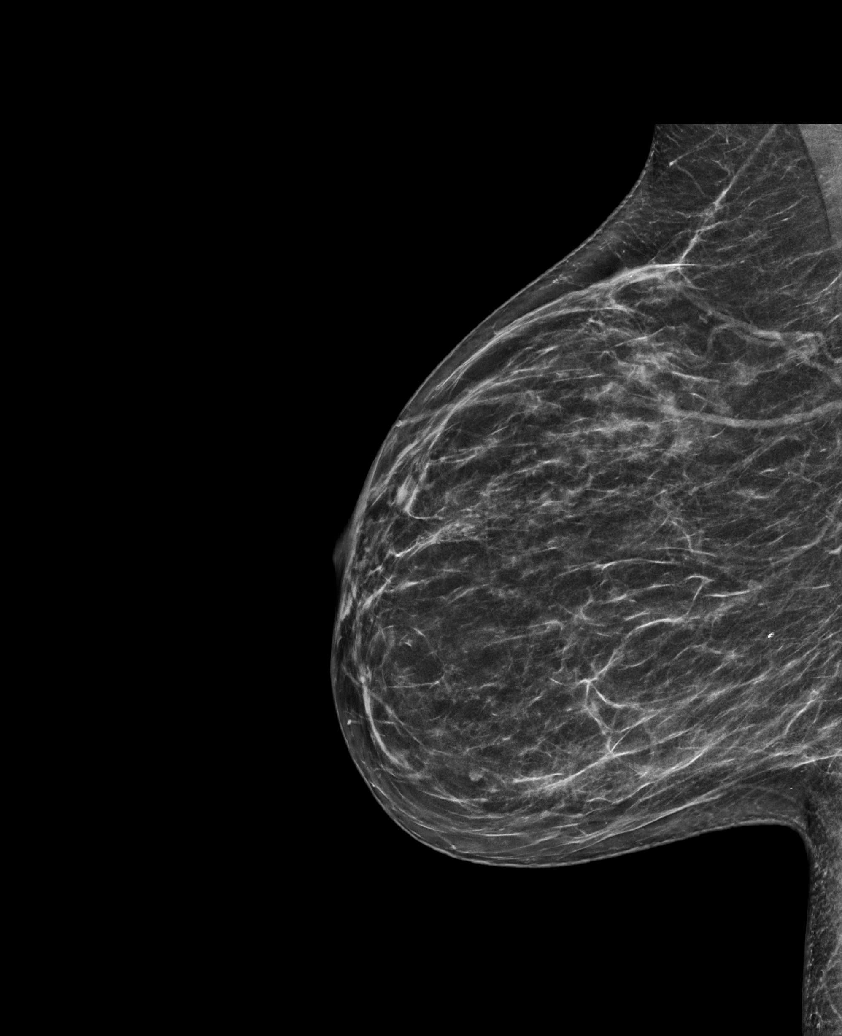

[L MLO synth-2D (1 of 2)]
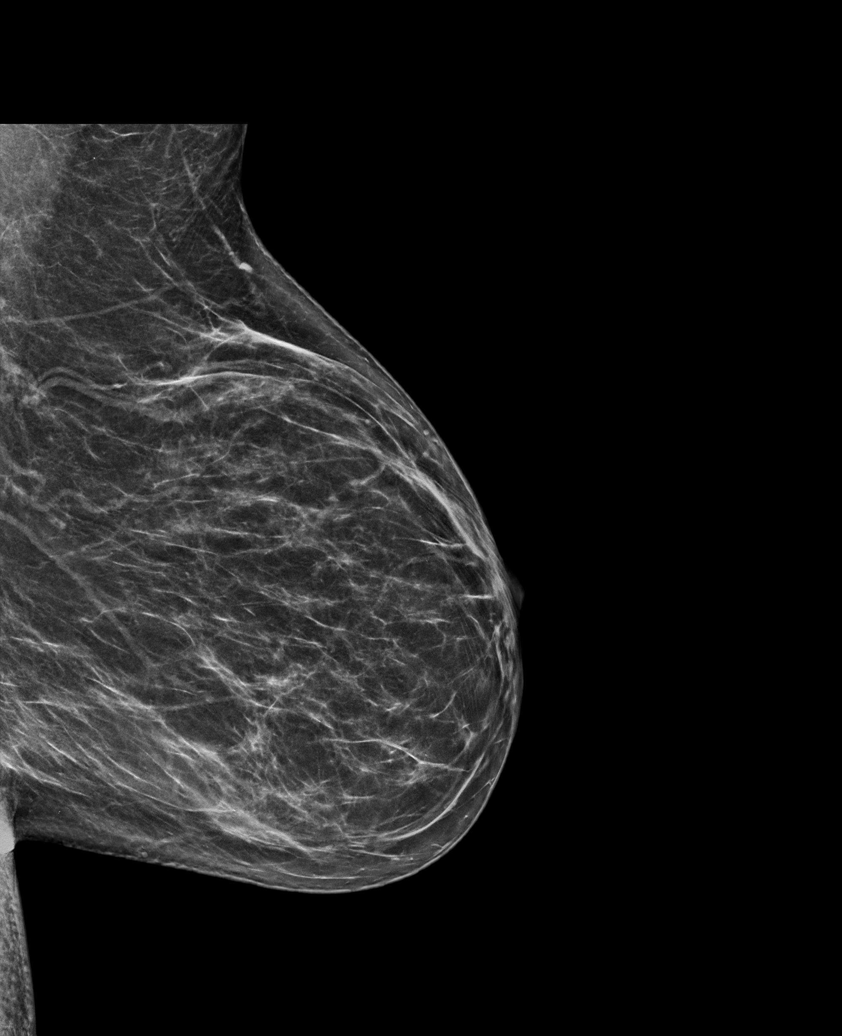

[R MLO synth-2D (2 of 2)]
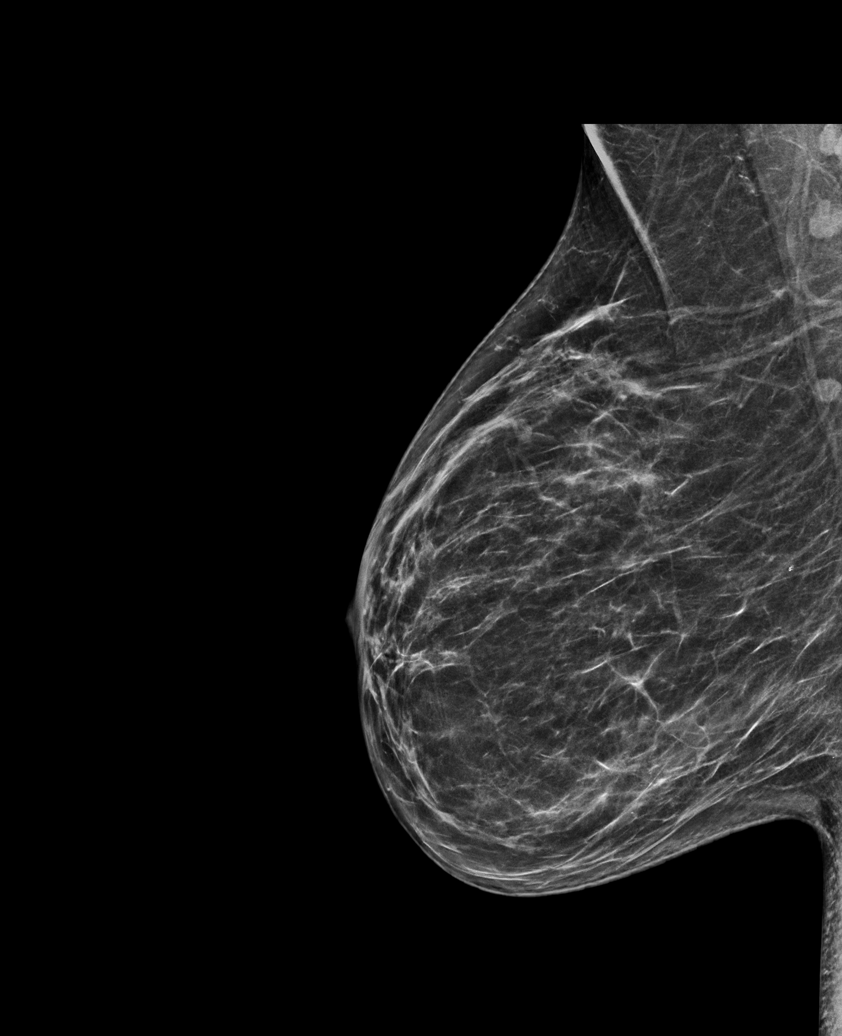

[L MLO synth-2D (2 of 2)]
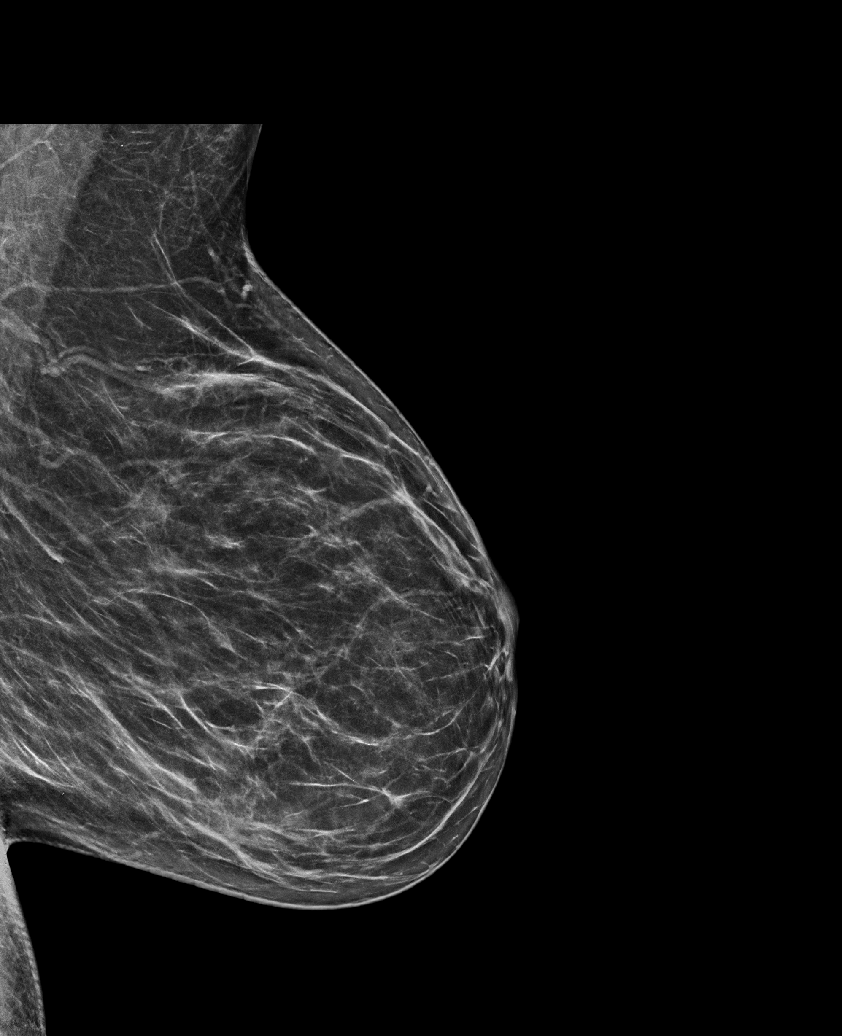

[L CC synth-2D]
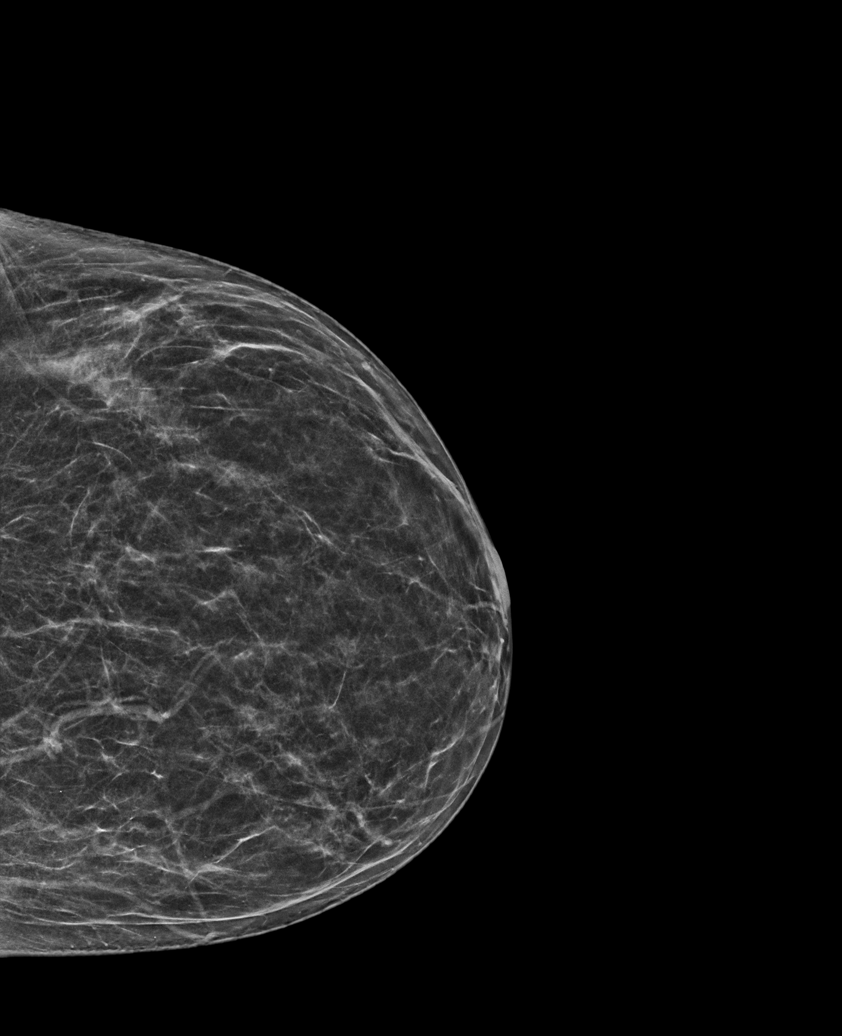

[R CC synth-2D]
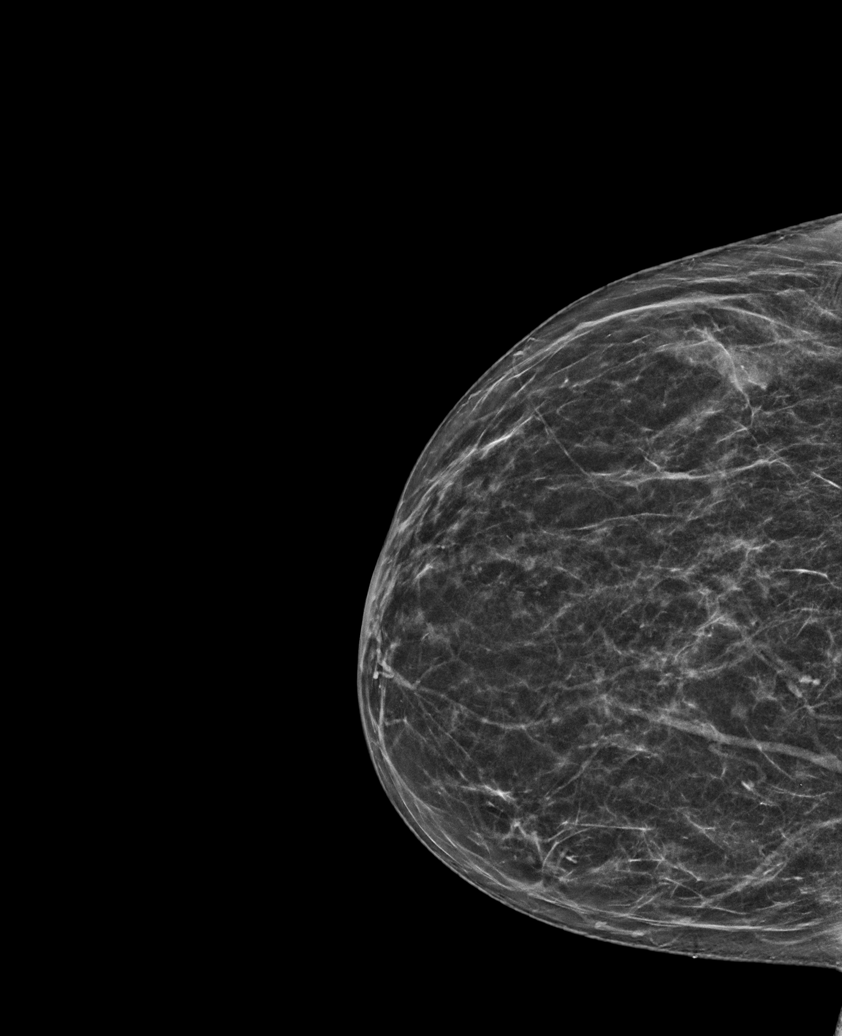

[6 of 36 positions shown; findings below may reference images not displayed]

ACR Breast Density Category b: There are scattered areas of
fibroglandular density.
FINDINGS: There are no findings suspicious for malignancy.
IMPRESSION: No mammographic evidence of malignancy. A result letter of this
screening mammogram will be mailed directly to the patient.

RECOMMENDATION:
Screening mammogram in one year. (Code:51-O-LD2)

BI-RADS CATEGORY  1: Negative.

## 2024-02-05 ENCOUNTER — Other Ambulatory Visit (HOSPITAL_COMMUNITY): Payer: Self-pay

## 2024-02-11 ENCOUNTER — Ambulatory Visit: Payer: BC Managed Care – PPO | Admitting: Dermatology

## 2024-03-14 ENCOUNTER — Other Ambulatory Visit: Payer: Self-pay | Admitting: Family

## 2024-05-13 NOTE — Telephone Encounter (Signed)
 Called patient to get her scheduled for a overdue follow up/physical. Patient is scheduled for 06/30/24 at 2:00 pm. Please advise for refill OK to fill until scheduled appt?

## 2024-05-18 ENCOUNTER — Encounter: Payer: Self-pay | Admitting: Family Medicine

## 2024-05-18 ENCOUNTER — Ambulatory Visit: Admitting: Family Medicine

## 2024-05-18 VITALS — BP 121/95 | HR 93 | Temp 98.8°F

## 2024-05-18 DIAGNOSIS — J0140 Acute pansinusitis, unspecified: Secondary | ICD-10-CM | POA: Diagnosis not present

## 2024-05-18 MED ORDER — PREDNISONE 10 MG PO TABS: ORAL_TABLET | ORAL | 0 refills | Status: DC

## 2024-05-18 MED ORDER — LEVOFLOXACIN 500 MG PO TABS: 500.0000 mg | ORAL_TABLET | Freq: Every day | ORAL | 0 refills | Status: AC

## 2024-05-18 NOTE — Progress Notes (Signed)
 Acute visit   Patient: Stephanie Boyd   DOB: 1976-01-20   48 y.o. Female  MRN: [id number] PCP: Marylynn Verneita CROME, MD   Chief Complaint  Patient presents with   Acute Visit    Thick mucus, greenish and yellow, head feels full, loosing voice, drainage in the back of throat, possible sinus infection X end of last week.Patient reports symptoms have worsened. She reports taking tylenol  for symptoms, last taken at 10 am. Patient has declined covid and influenza   Subjective    Discussed the use of AI scribe software for clinical note transcription with the patient, who gave verbal consent to proceed.  History of Present Illness   Stephanie Boyd is a 48 year old female with recurrent sinusitis who presents with symptoms of a sinus infection.  She typically has sinus infections every December and March with constant headache, head and ear fullness, dental and gum pain, heavy drainage that changes her voice, and no fever.  Current symptoms started Saturday night, with headaches beginning last week similar to her usual sinusitis. She uses a neti pot and deep breathing but feels the infection is persistent and needs treatment.  Her usual effective regimen is a 7-day course of Levaquin  with a prednisone  taper and probiotic. She is allergic to penicillins, sulfas, and vancomycin, which limits antibiotic options. She notes her tonsils are chronically enlarged and asks to have her ears checked for infection related to her sinus symptoms.       Review of Systems  Objective    BP (!) 121/95   Pulse 93   Temp 98.8 F (37.1 C) (Oral)   SpO2 100%  Physical Exam Vitals reviewed.  Constitutional:      General: She is not in acute distress.    Appearance: Normal appearance. She is well-developed. She is not diaphoretic.  HENT:     Head: Normocephalic and atraumatic.     Right Ear: Tympanic membrane, ear canal and external ear normal.     Left Ear: Tympanic membrane, ear canal and external  ear normal.     Nose: Congestion present.     Mouth/Throat:     Mouth: Mucous membranes are moist.     Pharynx: Oropharynx is clear. No oropharyngeal exudate.  Eyes:     General: No scleral icterus.    Conjunctiva/sclera: Conjunctivae normal.     Pupils: Pupils are equal, round, and reactive to light.  Cardiovascular:     Rate and Rhythm: Normal rate and regular rhythm.     Heart sounds: Normal heart sounds.  Pulmonary:     Effort: Pulmonary effort is normal. No respiratory distress.     Breath sounds: Normal breath sounds. No wheezing or rales.  Musculoskeletal:     Cervical back: Neck supple.     Right lower leg: No edema.     Left lower leg: No edema.  Lymphadenopathy:     Cervical: No cervical adenopathy.  Skin:    General: Skin is warm and dry.  Neurological:     Mental Status: She is alert.       No results found for any visits on 05/18/24.  Assessment & Plan     Problem List Items Addressed This Visit   None Visit Diagnoses       Acute non-recurrent pansinusitis    -  Primary   Relevant Medications   levofloxacin  (LEVAQUIN ) 500 MG tablet   predniSONE  (DELTASONE ) 10 MG tablet  Acute sinusitis Recurrent sinusitis with symptoms persisting for over a week, including headache, ear fullness, and dental pain. No fever reported. Symptoms suggestive of bacterial sinusitis given duration. Penicillin allergy noted, Levaquin  chosen as treatment. Discussed potential side effects of Levaquin , including tendon issues, and advised monitoring for Achilles pain. - Prescribed Levaquin  500 mg daily for 7 days - Prescribed prednisone  taper (6, 5, 4, 3, 2, 1) to be taken with breakfast - Advised taking antibiotics with food to minimize gastrointestinal upset - Recommended probiotic use during antibiotic course - Sent prescriptions to [facility] in [place] - Advised contacting Dr. Peg office if symptoms do not improve  Elevated blood pressure Blood pressure  reading of 91 diastolic noted, slightly elevated from previous readings. - Rechecked blood pressure before leaving the clinic - Advised home blood pressure monitoring if elevated readings persist - Instructed to inform Dr. Joaquin if home readings remain high       Meds ordered this encounter  Medications   levofloxacin  (LEVAQUIN ) 500 MG tablet    Sig: Take 1 tablet (500 mg total) by mouth daily for 7 days.    Dispense:  7 tablet    Refill:  0   predniSONE  (DELTASONE ) 10 MG tablet    Sig: Take 60mg  PO daily x1d, then 50mg  daily x1d, then 40mg  daily x1d, then 30mg  daily x1d, then 20mg  daily x1d, then 10mg  daily x1d, then stop    Dispense:  21 tablet    Refill:  0     Return if symptoms worsen or fail to improve.      Jon Eva, MD  [facility] [phone] (phone) [phone] (fax)  [facility]

## 2024-06-24 ENCOUNTER — Other Ambulatory Visit

## 2024-06-24 ENCOUNTER — Other Ambulatory Visit: Payer: Self-pay

## 2024-06-24 ENCOUNTER — Ambulatory Visit: Payer: Self-pay

## 2024-06-24 DIAGNOSIS — R300 Dysuria: Secondary | ICD-10-CM

## 2024-06-24 NOTE — Telephone Encounter (Signed)
 Pt thinks she may have a UTI. Pt is scheduled for her physical next Tuesday. Is it okay to have pt come in this week to collect a urine sample? I have pended the labs for approval.

## 2024-06-24 NOTE — Telephone Encounter (Signed)
 FYI Only or Action Required?: Action required by provider: clinical question for provider.  Patient was last seen in primary care on 05/18/2024 by Myrla Jon HERO, MD.  Called Nurse Triage reporting Dysuria.  Symptoms began Monday .  Interventions attempted: Other: herbal teas.  Symptoms are: stable.  Triage Disposition: Discuss With PCP and Callback by Nurse Today (overriding See Physician Within 24 Hours)  Patient/caregiver understands and will follow disposition?: No, wishes to speak with PCP      Reason for Disposition  All other patients with painful urination  (Exception: [1] EITHER frequency or urgency AND [2] has on-call doctor.)  Answer Assessment - Initial Assessment Questions Outbound call placed to patient to discuss below.  Spoke with who reports symptoms started  last week some improvement and last night returned with urge to urinate frequently. When goes urine cloudy and pustulating feeling when urinating , mild right now.  Going more than few drops last time went to urinate was a lot. Thinks foul odor. UTI long time ago was a teen thinks it was bad  trying to catch it before that this time .  Drinking herbal and fluids. Wants to come into lab and drop urine sample has appointment next week for physical. Pt does state will call for office visit sooner if symptoms worsen or new symptoms. Advised will route to office for review further advisement on urine order request  patient agreeable and will await call back   1. SEVERITY: How bad is the pain?  (e.g., Scale 1-10; mild, moderate, or severe)     Mild pain  2. FREQUENCY: How many times have you had painful urination today?      yes 3. PATTERN: Is pain present every time you urinate or just sometimes?      Each time 4. ONSET: When did the painful urination start?      Monday  5. FEVER: Do you have a fever? If Yes, ask: What is your temperature, how was it measured, and when did it start?     Denies   6. PAST UTI: Have you had a urine infection before? If Yes, ask: When was the last time? and What happened that time?      Long time ago  7. CAUSE: What do you think is causing the painful urination?  (e.g., UTI, scratch, Herpes sore)     Unknown  8. OTHER SYMPTOMS: Do you have any other symptoms? (e.g., blood in urine, flank pain, genital sores, urgency, vaginal discharge)       Patient denies fever, chills, flank pain, back pain, vomiting , vaginal discharge, abdominal pain  Protocols used: Urination Pain - Female-A-AH Message from Ivette P sent at 06/24/2024  9:16 AM EST  Reason for Triage: come in for a specimen. - alot fo frequency and need to urinate alot. urine is cloudy and pulsating.

## 2024-06-24 NOTE — Telephone Encounter (Signed)
Spoke with pt and scheduled her for a lab appt this afternoon.

## 2024-06-25 LAB — MICROSCOPIC EXAMINATION
Bacteria, UA: NONE SEEN
Casts: NONE SEEN /LPF
RBC, Urine: NONE SEEN /HPF (ref 0–2)

## 2024-06-25 LAB — URINALYSIS, ROUTINE W REFLEX MICROSCOPIC
Bilirubin, UA: NEGATIVE
Glucose, UA: NEGATIVE
Ketones, UA: NEGATIVE
Nitrite, UA: NEGATIVE
Protein,UA: NEGATIVE
Specific Gravity, UA: 1.006 (ref 1.005–1.030)
Urobilinogen, Ur: 0.2 mg/dL (ref 0.2–1.0)
pH, UA: 6.5 (ref 5.0–7.5)

## 2024-06-28 LAB — URINE CULTURE

## 2024-06-29 ENCOUNTER — Other Ambulatory Visit: Payer: Self-pay | Admitting: Internal Medicine

## 2024-06-29 ENCOUNTER — Encounter: Payer: Self-pay | Admitting: Internal Medicine

## 2024-06-29 MED ORDER — CEFDINIR 300 MG PO CAPS: 300.0000 mg | ORAL_CAPSULE | Freq: Two times a day (BID) | ORAL | 0 refills | Status: AC

## 2024-06-29 MED ORDER — PREDNISONE 10 MG PO TABS: ORAL_TABLET | ORAL | 0 refills | Status: AC

## 2024-06-30 ENCOUNTER — Ambulatory Visit: Admitting: Internal Medicine

## 2024-09-02 ENCOUNTER — Encounter: Admitting: Internal Medicine

## 2024-09-02 ENCOUNTER — Ambulatory Visit: Admitting: Internal Medicine
